# Patient Record
Sex: Male | Born: 1975 | Race: White | Hispanic: No | Marital: Married | Smoking: Never smoker
Health system: Southern US, Community
[De-identification: ages and names within clinical notes are randomized; demographics above are authoritative.]

## PROBLEM LIST (undated history)

## (undated) DIAGNOSIS — K4030 Unilateral inguinal hernia, with obstruction, without gangrene, not specified as recurrent: Secondary | ICD-10-CM

## (undated) HISTORY — DX: Unilateral inguinal hernia, with obstruction, without gangrene, not specified as recurrent: K40.30

---

## 2014-02-16 ENCOUNTER — Ambulatory Visit: Payer: Self-pay | Admitting: Family Medicine

## 2015-11-12 IMAGING — US US EXTREM NON VASC*L* COMPLETE
1 series · 14 of 25 positions shown · non-contrast
Comparison: None

CLINICAL DATA: Palpable left groin mass for 4 months.

EXAM:
ULTRASOUND LEFT LOWER EXTREMITY LIMITED
TECHNIQUE: Ultrasound examination of the lower extremity soft tissues was
performed in the area of clinical concern.

[Series 1: us extrem non vasc*left* complete · 0.06mm/px · 33 acquisitions, 14 frames shown]
[im 1/33]
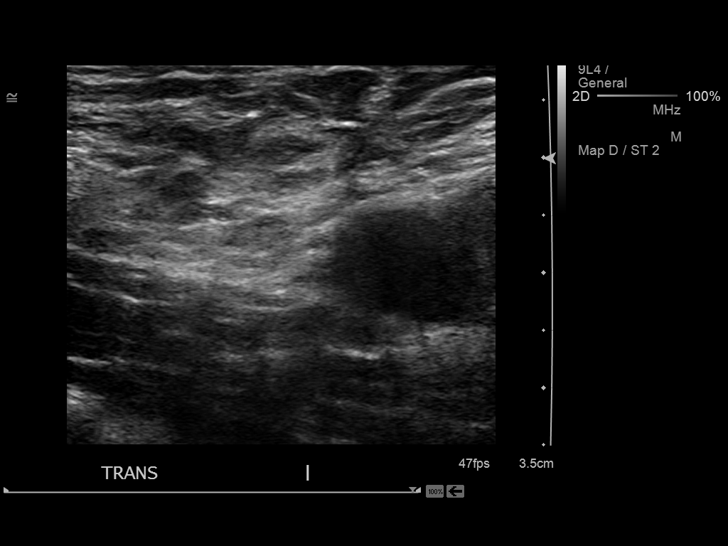
[im 3/33]
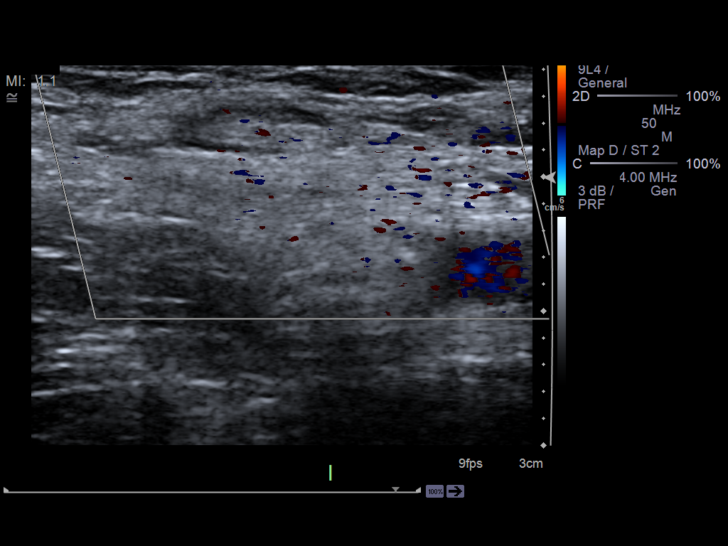
[im 6/33]
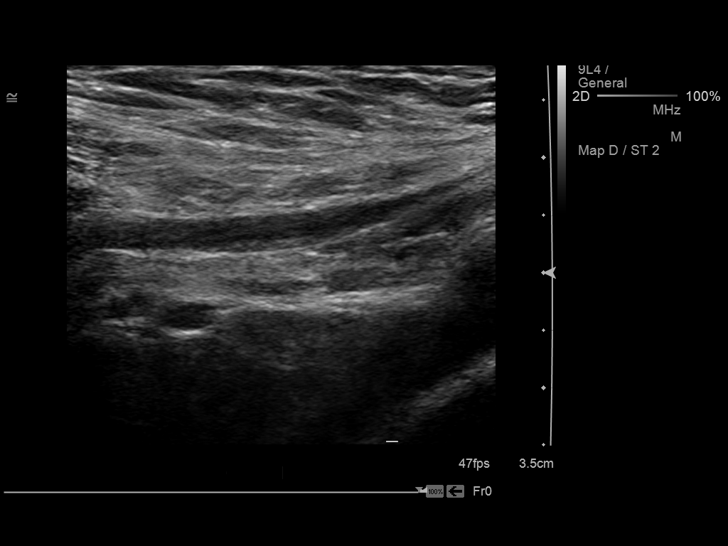
[im 9/33]
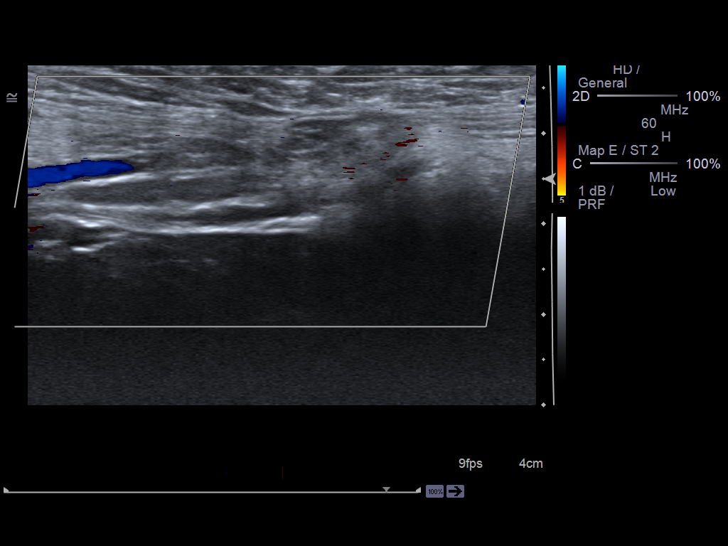
[im 11/33]
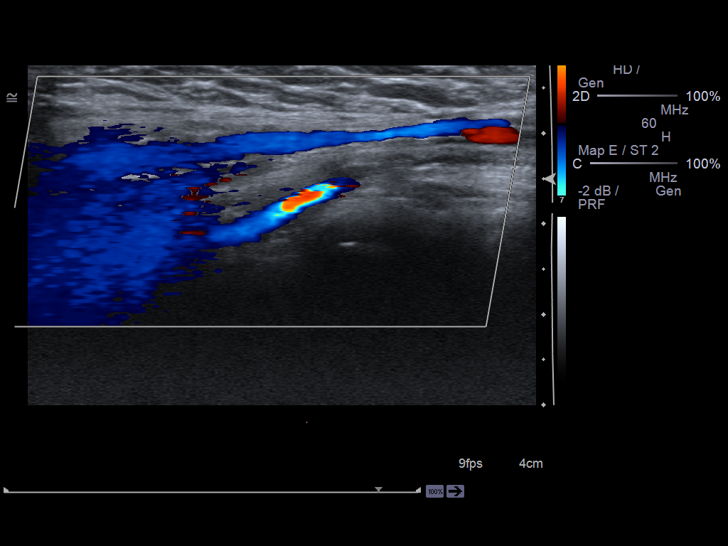
[im 13/33]
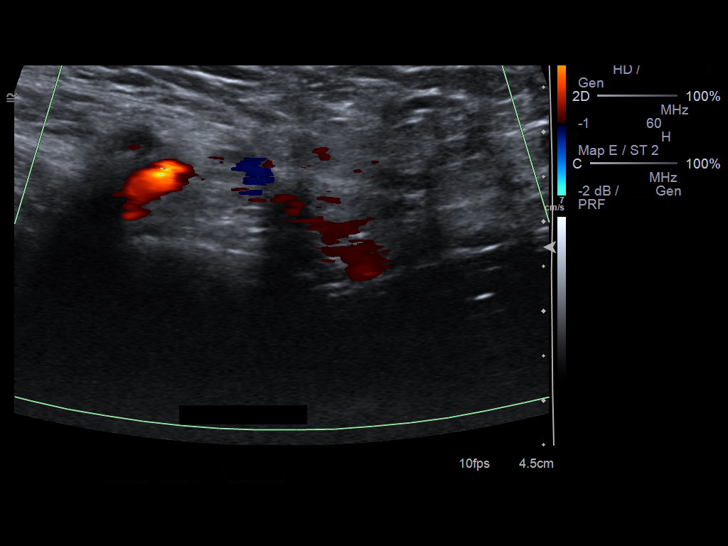
[im 15/33]
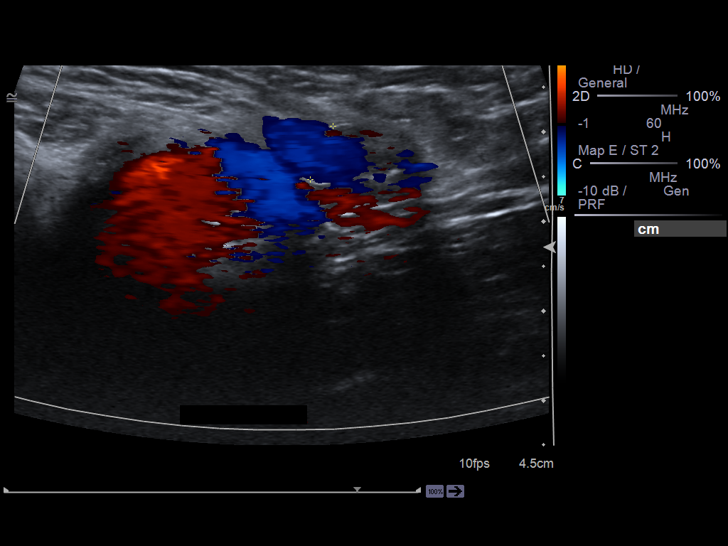
[im 18/33]
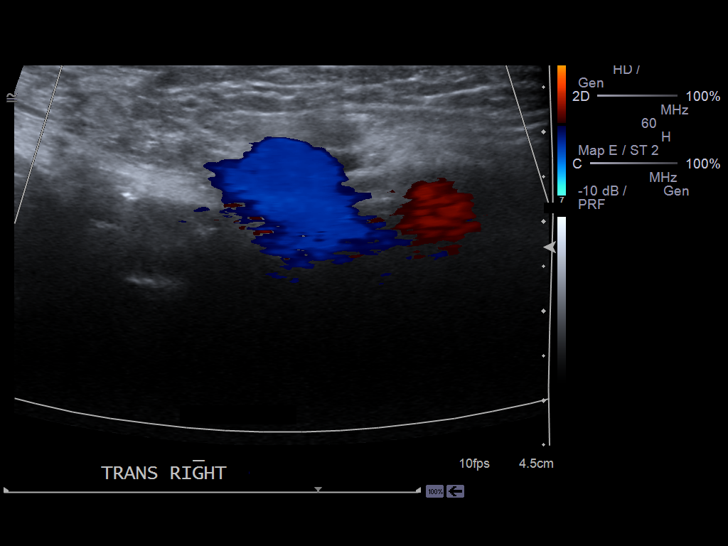
[im 21/33]
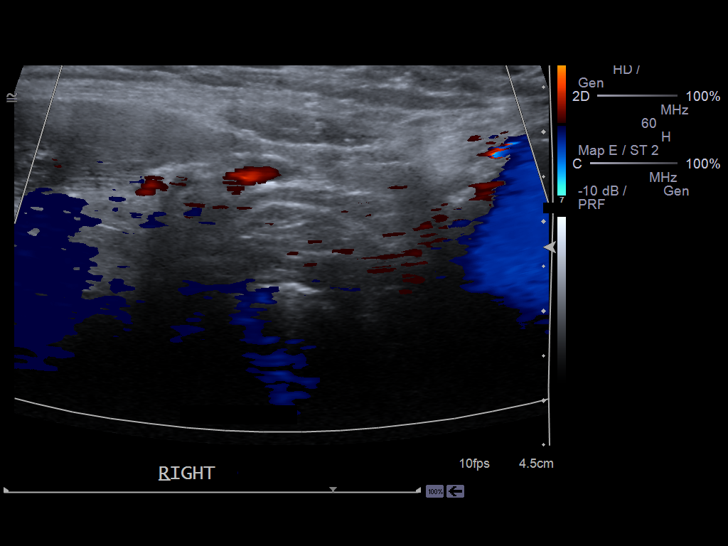
[im 22/33]
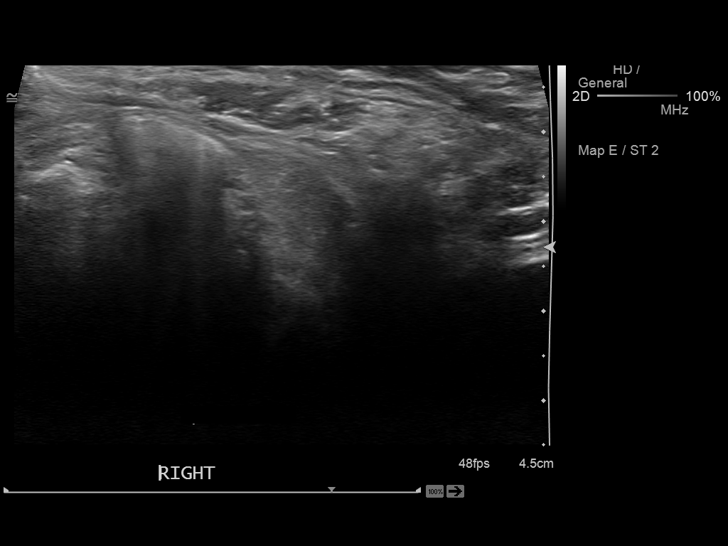
[im 25/33]
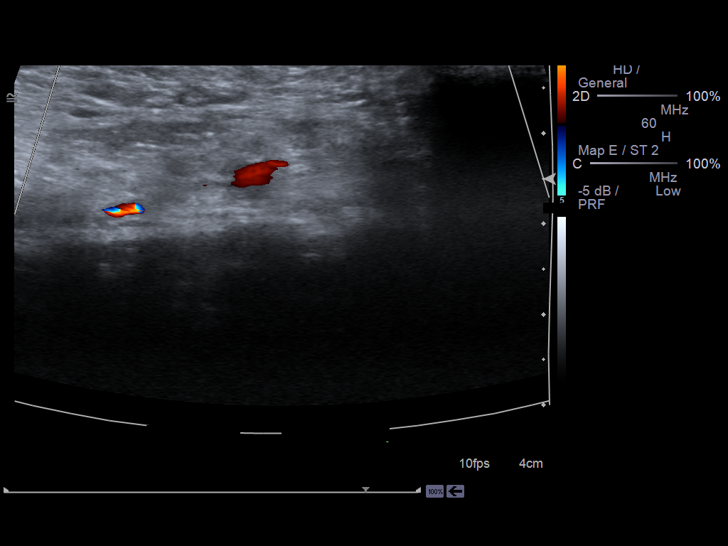
[im 27/33]
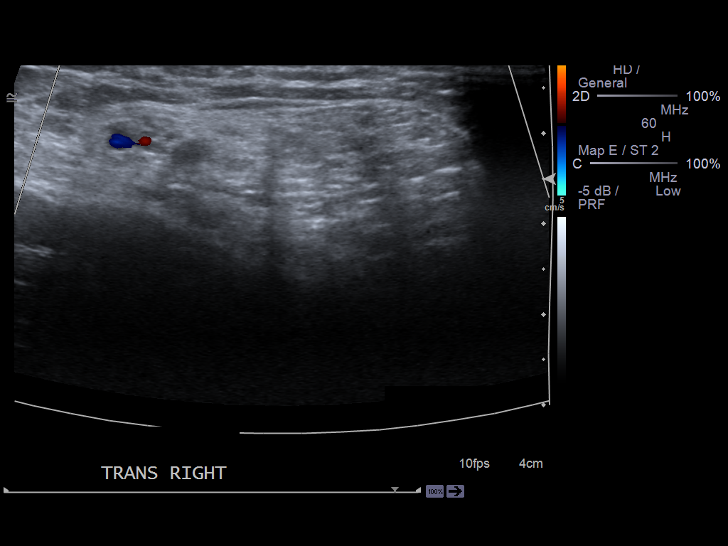
[im 30/33]
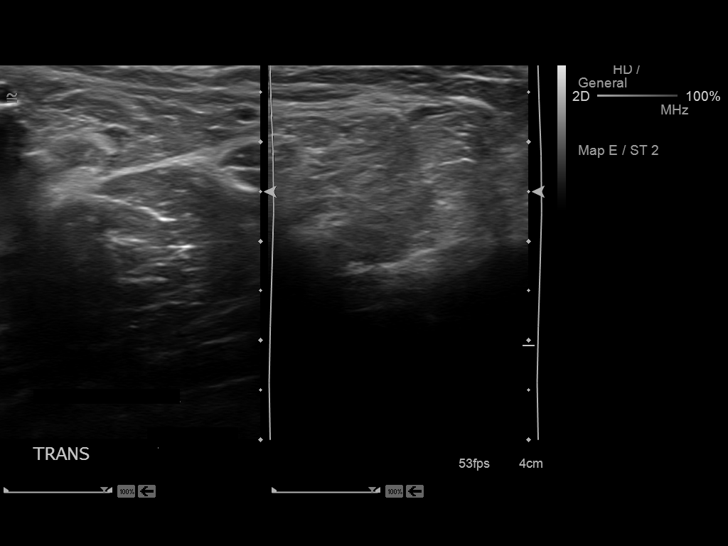
[im 33/33]
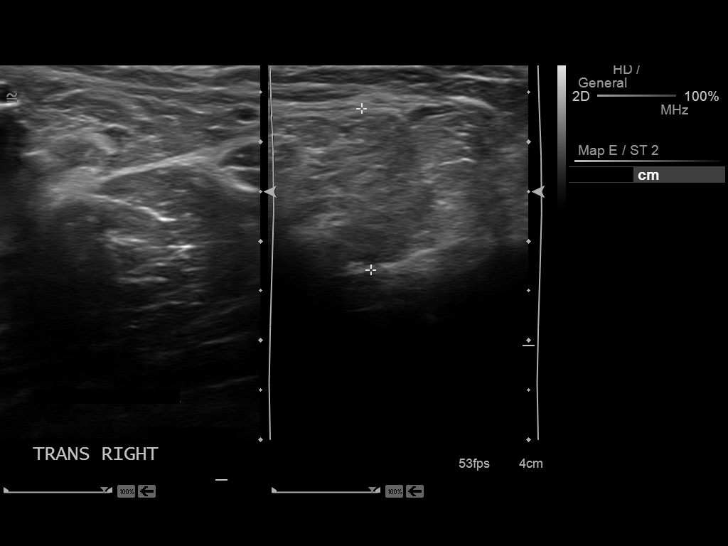

[14 of 25 positions shown; findings below may reference images not displayed]

FINDINGS: Targeted ultrasound was performed of the left groin in the area of
clinical concern. In the standing position, there is asymmetric soft
tissue which protrudes into the left inguinal region with
sonographic appearance suggestive of fat. No bowel is identified.

A mildly prominent left inguinal lymph node measures 1.6 x 0.5 x
cm with unremarkable morphologic appearance. Slightly prominent
veins were noted in the left groin with Valsalva.
IMPRESSION: Findings suggestive fat containing left inguinal hernia.

## 2016-06-18 ENCOUNTER — Emergency Department: Payer: 59

## 2016-06-18 ENCOUNTER — Observation Stay
Admission: EM | Admit: 2016-06-18 | Discharge: 2016-06-19 | DRG: 352 | Disposition: A | Discharge status: Home / Self Care | Payer: 59 | Attending: Surgery | Admitting: Surgery

## 2016-06-18 ENCOUNTER — Encounter: Payer: Self-pay | Admitting: *Deleted

## 2016-06-18 DIAGNOSIS — K4030 Unilateral inguinal hernia, with obstruction, without gangrene, not specified as recurrent: Principal | ICD-10-CM | POA: Diagnosis present

## 2016-06-18 DIAGNOSIS — K4090 Unilateral inguinal hernia, without obstruction or gangrene, not specified as recurrent: Secondary | ICD-10-CM | POA: Diagnosis not present

## 2016-06-18 DIAGNOSIS — R1032 Left lower quadrant pain: Secondary | ICD-10-CM | POA: Diagnosis present

## 2016-06-18 LAB — URINALYSIS, COMPLETE (UACMP) WITH MICROSCOPIC
Bacteria, UA: NONE SEEN
Bilirubin Urine: NEGATIVE
Glucose, UA: NEGATIVE mg/dL
Hgb urine dipstick: NEGATIVE
Ketones, ur: 5 mg/dL — AB
LEUKOCYTES UA: NEGATIVE
NITRITE: NEGATIVE
PH: 6 (ref 5.0–8.0)
Protein, ur: NEGATIVE mg/dL
SPECIFIC GRAVITY, URINE: 1.019 (ref 1.005–1.030)

## 2016-06-18 LAB — CBC
HEMATOCRIT: 47.2 % (ref 40.0–52.0)
HEMOGLOBIN: 16.2 g/dL (ref 13.0–18.0)
MCH: 28.8 pg (ref 26.0–34.0)
MCHC: 34.3 g/dL (ref 32.0–36.0)
MCV: 83.9 fL (ref 80.0–100.0)
Platelets: 295 10*3/uL (ref 150–440)
RBC: 5.63 MIL/uL (ref 4.40–5.90)
RDW: 13.1 % (ref 11.5–14.5)
WBC: 14.3 10*3/uL — ABNORMAL HIGH (ref 3.8–10.6)

## 2016-06-18 LAB — COMPREHENSIVE METABOLIC PANEL
ALK PHOS: 69 U/L (ref 38–126)
ALT: 19 U/L (ref 17–63)
AST: 18 U/L (ref 15–41)
Albumin: 4.6 g/dL (ref 3.5–5.0)
Anion gap: 7 (ref 5–15)
BILIRUBIN TOTAL: 0.9 mg/dL (ref 0.3–1.2)
BUN: 16 mg/dL (ref 6–20)
CALCIUM: 9.3 mg/dL (ref 8.9–10.3)
CO2: 29 mmol/L (ref 22–32)
CREATININE: 0.79 mg/dL (ref 0.61–1.24)
Chloride: 101 mmol/L (ref 101–111)
Glucose, Bld: 100 mg/dL — ABNORMAL HIGH (ref 65–99)
Potassium: 3.5 mmol/L (ref 3.5–5.1)
Sodium: 137 mmol/L (ref 135–145)
TOTAL PROTEIN: 7.9 g/dL (ref 6.5–8.1)

## 2016-06-18 MED ORDER — ACETAMINOPHEN 500 MG PO TABS
1000.0000 mg | ORAL_TABLET | Freq: Four times a day (QID) | ORAL | Status: DC
  Administered 2016-06-18 – 2016-06-19 (×2): 1000 mg via ORAL
  Filled 2016-06-18 (×2): qty 2

## 2016-06-18 MED ORDER — METHOCARBAMOL 500 MG PO TABS: 500.0000 mg | ORAL_TABLET | Freq: Four times a day (QID) | ORAL | Status: DC | PRN

## 2016-06-18 MED ORDER — ONDANSETRON HCL 4 MG/2ML IJ SOLN: 4.0000 mg | Freq: Four times a day (QID) | INTRAMUSCULAR | Status: DC | PRN

## 2016-06-18 MED ORDER — CEFAZOLIN SODIUM-DEXTROSE 2-3 GM-% IV SOLR
2.0000 g | INTRAVENOUS | Status: AC
  Administered 2016-06-19: 2 g via INTRAVENOUS
  Filled 2016-06-18 (×3): qty 50

## 2016-06-18 MED ORDER — OXYCODONE-ACETAMINOPHEN 5-325 MG PO TABS
1.0000 | ORAL_TABLET | ORAL | Status: DC | PRN
  Administered 2016-06-18: 1 via ORAL

## 2016-06-18 MED ORDER — IOPAMIDOL (ISOVUE-300) INJECTION 61%
30.0000 mL | Freq: Once | INTRAVENOUS | Status: AC | PRN
  Administered 2016-06-18: 30 mL via ORAL
  Filled 2016-06-18: qty 30

## 2016-06-18 MED ORDER — OXYCODONE HCL 5 MG PO TABS: 5.0000 mg | ORAL_TABLET | ORAL | Status: DC | PRN

## 2016-06-18 MED ORDER — HYDRALAZINE HCL 20 MG/ML IJ SOLN: 10.0000 mg | INTRAMUSCULAR | Status: DC | PRN

## 2016-06-18 MED ORDER — ONDANSETRON 4 MG PO TBDP: 4.0000 mg | ORAL_TABLET | Freq: Four times a day (QID) | ORAL | Status: DC | PRN

## 2016-06-18 MED ORDER — KETOROLAC TROMETHAMINE 30 MG/ML IJ SOLN
30.0000 mg | Freq: Four times a day (QID) | INTRAMUSCULAR | Status: DC
  Administered 2016-06-18 – 2016-06-19 (×2): 30 mg via INTRAVENOUS
  Filled 2016-06-18 (×2): qty 1

## 2016-06-18 MED ORDER — SODIUM CHLORIDE 0.9 % IV BOLUS (SEPSIS): 1000.0000 mL | Freq: Once | INTRAVENOUS | Status: DC

## 2016-06-18 MED ORDER — ALPRAZOLAM 0.5 MG PO TABS: 0.5000 mg | ORAL_TABLET | Freq: Three times a day (TID) | ORAL | Status: DC | PRN

## 2016-06-18 MED ORDER — CEFAZOLIN SODIUM-DEXTROSE 2-4 GM/100ML-% IV SOLN
2.0000 g | Freq: Once | INTRAVENOUS | Status: DC
  Filled 2016-06-18: qty 100

## 2016-06-18 MED ORDER — PANTOPRAZOLE SODIUM 40 MG IV SOLR
40.0000 mg | Freq: Every day | INTRAVENOUS | Status: DC
  Administered 2016-06-18: 40 mg via INTRAVENOUS
  Filled 2016-06-18: qty 40

## 2016-06-18 MED ORDER — IOPAMIDOL (ISOVUE-300) INJECTION 61%
100.0000 mL | Freq: Once | INTRAVENOUS | Status: AC | PRN
  Administered 2016-06-18: 100 mL via INTRAVENOUS
  Filled 2016-06-18: qty 100

## 2016-06-18 MED ORDER — MORPHINE SULFATE (PF) 4 MG/ML IV SOLN: 4.0000 mg | INTRAVENOUS | Status: DC | PRN

## 2016-06-18 MED ORDER — SODIUM CHLORIDE 0.9 % IV BOLUS (SEPSIS)
1000.0000 mL | Freq: Once | INTRAVENOUS | Status: AC
  Administered 2016-06-18: 1000 mL via INTRAVENOUS

## 2016-06-18 MED ORDER — OXYCODONE-ACETAMINOPHEN 5-325 MG PO TABS
ORAL_TABLET | ORAL | Status: AC
  Filled 2016-06-18: qty 1

## 2016-06-18 MED ORDER — DEXTROSE IN LACTATED RINGERS 5 % IV SOLN
INTRAVENOUS | Status: DC
  Administered 2016-06-18: via INTRAVENOUS

## 2016-06-18 MED ORDER — ENOXAPARIN SODIUM 40 MG/0.4ML ~~LOC~~ SOLN
40.0000 mg | Freq: Once | SUBCUTANEOUS | Status: DC
  Filled 2016-06-18: qty 0.4

## 2016-06-18 NOTE — ED Triage Notes (Addendum)
States left groin pain, states he noticed a hernia in the area about a year ago, states Friday night pain increased in left groin, states he feels as if the hernia is larger then it was, denies any discoloration,  States some scrotal swelling, denies any urinary symptoms

## 2016-06-18 NOTE — ED Provider Notes (Signed)
[facility] Emergency Department Provider Note  ____________________________________________  Time seen: Approximately 5:52 PM  I have reviewed the triage vital signs and the nursing notes.   HISTORY  Chief Complaint Groin Pain    HPI Jake Diaz is a 41 y.o. male who complains of left groin pain with a large area of swelling that developed over the last 3 days. He reports a history of a hernia in that area over the past year, which had not bothered him much, but over the last several days has become more persistent and severe. Still having bowel movements. No vomiting. Able to eat and drink. No fever or chills.     History reviewed. No pertinent past medical history.   There are no active problems to display for this patient.    History reviewed. No pertinent surgical history.   Prior to Admission medications   Medication Sig Start Date End Date Taking? Authorizing Provider  acetaminophen (TYLENOL) 325 MG tablet Take 650 mg by mouth every 6 (six) hours as needed.   Yes Historical Provider, MD     Allergies Patient has no known allergies.   No family history on file.  Social History Social History  Substance Use Topics  . Smoking status: Never Smoker  . Smokeless tobacco: Never Used  . Alcohol use No    Review of Systems  Constitutional:   No fever or chills.  ENT:   No sore throat. No rhinorrhea. Cardiovascular:   No chest pain. Respiratory:   No dyspnea or cough. Gastrointestinal:   Positive lower abdominal pain as above. No vomiting or diarrhea. No constipation.   Genitourinary:   Negative for dysuria or difficulty urinating. Musculoskeletal:   Negative for focal pain or swelling Neurological:   Negative for headaches 10-point ROS otherwise negative.  ____________________________________________   PHYSICAL EXAM:  VITAL SIGNS: ED Triage Vitals  Enc Vitals Group     BP 06/18/16 1105 (!) 141/88     Pulse Rate  06/18/16 1105 94     Resp 06/18/16 1105 18     Temp 06/18/16 1105 98.4 F (36.9 C)     Temp Source 06/18/16 1105 Oral     SpO2 06/18/16 1105 98 %     Weight 06/18/16 1106 150 lb (68 kg)     Height 06/18/16 1106 5\' 7"  (1.702 m)     Head Circumference --      Peak Flow --      Pain Score 06/18/16 1113 9     Pain Loc --      Pain Edu? --      Excl. in GC? --     Vital signs reviewed, nursing assessments reviewed.   Constitutional:   Alert and oriented. Well appearing and in no distress. Eyes:   No scleral icterus. No conjunctival pallor. PERRL. EOMI.  No nystagmus. ENT   Head:   Normocephalic and atraumatic.   Nose:   No congestion/rhinnorhea. No septal hematoma   Mouth/Throat:   MMM, no pharyngeal erythema. No peritonsillar mass.    Neck:   No stridor. No SubQ emphysema. No meningismus. Hematological/Lymphatic/Immunilogical:   No cervical lymphadenopathy. Cardiovascular:   RRR. Symmetric bilateral radial and DP pulses.  No murmurs.  Respiratory:   Normal respiratory effort without tachypnea nor retractions. Breath sounds are clear and equal bilaterally. No wheezes/rales/rhonchi. Gastrointestinal:   Soft with a large soft tissue mass palpable in the left scrotum extending up to the inguinal canal. Tender to the touch. Not  reducible on exam. Abdomen is non- distended. There is no CVA tenderness.  No rebound, rigidity, or guarding. Genitourinary:   Left scrotal mass as above. No perineal inflammation. Unremarkable penis Musculoskeletal:   Normal range of motion in all extremities. No joint effusions.  No lower extremity tenderness.  No edema. Neurologic:   Normal speech and language.  CN 2-10 normal. Motor grossly intact. No gross focal neurologic deficits are appreciated.  Skin:    Skin is warm, dry and intact. No rash noted.  No petechiae, purpura, or bullae.  ____________________________________________    LABS (pertinent positives/negatives) (all labs ordered are  listed, but only abnormal results are displayed) Labs Reviewed  URINALYSIS, COMPLETE (UACMP) WITH MICROSCOPIC - Abnormal; Notable for the following:       Result Value   Color, Urine YELLOW (*)    APPearance CLEAR (*)    Ketones, ur 5 (*)    Squamous Epithelial / LPF 0-5 (*)    All other components within normal limits  CBC - Abnormal; Notable for the following:    WBC 14.3 (*)    All other components within normal limits  COMPREHENSIVE METABOLIC PANEL - Abnormal; Notable for the following:    Glucose, Bld 100 (*)    All other components within normal limits   ____________________________________________   EKG    ____________________________________________    RADIOLOGY  Ct Abdomen Pelvis W Contrast  Result Date: 06/18/2016 CLINICAL DATA:  Left groin pain, hernia, scrotal swelling EXAM: CT ABDOMEN AND PELVIS WITH CONTRAST TECHNIQUE: Multidetector CT imaging of the abdomen and pelvis was performed using the standard protocol following bolus administration of intravenous contrast. CONTRAST:  ISOVUE-300 IOPAMIDOL (ISOVUE-300) INJECTION 61% COMPARISON:  None. FINDINGS: Lower chest: Lung bases are clear. Hepatobiliary: Liver is within normal limits, noting a 4 mm probable cyst in segment 8 (series 2/image 19). Gallbladder is unremarkable. No intrahepatic or extrahepatic ductal dilatation. Pancreas: Within normal limits. Spleen: Within normal limits. Adrenals/Urinary Tract: Adrenal glands are within normal limits. Kidneys are within normal limits.  No hydronephrosis. Bladder is within normal limits. Stomach/Bowel: Stomach is within normal limits. No evidence of bowel obstruction. Normal appendix (series 2/ image 53). Vascular/Lymphatic: No evidence of abdominal aortic aneurysm. Circumaortic left renal vein. No suspicious abdominopelvic lymphadenopathy. Reproductive: Prostate is unremarkable. Other: No abdominopelvic ascites. Moderate to large fat containing left inguinal/ scrotal  hernia. Trace fluid within the hernia as it enters the left scrotum (series 2/image 90), incompletely visualized. Musculoskeletal: Visualized osseous structures are within normal limits. IMPRESSION: Moderate to large fat containing left inguinal/ scrotal hernia, incompletely visualized. Electronically Signed   By: Charline Bills M.D.   On: 06/18/2016 16:25    ____________________________________________   PROCEDURES Procedures  ____________________________________________   INITIAL IMPRESSION / ASSESSMENT AND PLAN / ED COURSE  Pertinent labs & imaging results that were available during my care of the patient were reviewed by me and considered in my medical decision making (see chart for details).  Patient presents with abdominal pain, on exam found to have large hernia, likely indirect inguinal hernia. Concern for incarcerated bowel.     Clinical Course as of Jun 19 1750  Mon Jun 18, 2016  1432 D/w Pabon. Rec. CT for further delineation of tissue/extent and then will consult.   [PS]    Clinical Course User Index [PS] Sharman Cheek, MD    ----------------------------------------- 5:57 PM on 06/18/2016 -----------------------------------------  Discussed with Dr. Everlene Farrier after his evaluation of the patient. Will admit for surgical management. No evidence  of  bowel involvement.   ____________________________________________   FINAL CLINICAL IMPRESSION(S) / ED DIAGNOSES  Final diagnoses:  Inguinal hernia of left side without obstruction or gangrene      New Prescriptions   No medications on file     Portions of this note were generated with dragon dictation software. Dictation errors may occur despite best attempts at proofreading.    Sharman CheekPhillip Jonnell Hentges, MD 06/18/16 [id number]

## 2016-06-18 NOTE — H&P (Signed)
Patient ID: Jake Diaz, male   DOB: 1976/01/10, 41 y.o.   MRN: [id number]  HPI Jake Diaz is a 41 y.o. male with a 3 day hx of left inguinal pain. He reports the pain is moderate to severe in intensity, sharp and intermittent and  located left inguinal area and worsens with movement. HE has had a hx of inguinal hernia for about 1 1/2 years but it was asymptomatic until last week. HE does have good cv performance and is able to do more than 4 METS w/o SOb or C/P. HE is otherwise healthy and no previous abd operations Ct scan reviewed and there is a large LIH w a piece of incarcerated fat. No bowel involvement.  HPI  History reviewed. No pertinent past medical history.  History reviewed. No pertinent surgical history.  No family history on file.  Social History Social History  Substance Use Topics  . Smoking status: Never Smoker  . Smokeless tobacco: Never Used  . Alcohol use No    No Known Allergies  Current Facility-Administered Medications  Medication Dose Route Frequency Provider Last Rate Last Dose  . oxyCODONE-acetaminophen (PERCOCET/ROXICET) 5-325 MG per tablet 1 tablet  1 tablet Oral Q30 min PRN Sharman CheekPhillip Stafford, MD   1 tablet at 06/18/16 1130  . oxyCODONE-acetaminophen (PERCOCET/ROXICET) 5-325 MG per tablet            No current outpatient prescriptions on file.     Review of Systems A 10 point review of systems was asked and was negative except for the information on the HPI  Physical Exam Blood pressure 133/85, pulse 76, temperature 98.4 F (36.9 C), temperature source Oral, resp. rate 18, height 5\' 7"  (1.702 m), weight 68 kg (150 lb), SpO2 96 %. CONSTITUTIONAL: NAD EYES: Pupils are equal, round, and reactive to light, Sclera are non-icteric. EARS, NOSE, MOUTH AND THROAT: The oropharynx is clear. The oral mucosa is pink and moist. Hearing is intact to voice. LYMPH NODES:  Lymph nodes in the neck are normal. RESPIRATORY:  Lungs are clear. There is  normal respiratory effort, with equal breath sounds bilaterally, and without pathologic use of accessory muscles. CARDIOVASCULAR: Heart is regular without murmurs, gallops, or rubs. GI: The abdomen is  soft, No peritonitis. There is an incarcerated left inguinal hernia. With significant tenderness to palpation GU:  MUSCULOSKELETAL: Normal muscle strength and tone. No cyanosis or edema.   SKIN: Turgor is good and there are no pathologic skin lesions or ulcers. NEUROLOGIC: Motor and sensation is grossly normal. Cranial nerves are grossly intact. PSYCH:  Oriented to person, place and time. Affect is normal.  Data Reviewed  I have personally reviewed the patient's imaging, laboratory findings and medical records.    Assessment/Plan Incarcerated left inguinal hernia containing fat. No evidence of obstruction or strangulation. DW the pt in detail and I do recommend admission to the hospital and repair in am. I have explained the procedure, risks, and aftercare of inguinal hernia repair to Jake Burnavid Isaac LantnerRisks include but are not limited to bleeding, infection, wound problems, anesthesia, recurrence, bladder or intestine injury, urinary retention, testicular dysfunction, chronic pain, mesh problems.  He  seems to understand and agrees to proceed.  Questions were answered to his stated satisfaction. We will attempt a laparoscopic approach if feasible but he does understand that there is a higher chance of conversion to open    Sterling Bigiego Aymara Sassi, MD FACS General Surgeon 06/18/2016, 5:36 PM

## 2016-06-18 NOTE — ED Triage Notes (Signed)
[facility] possible [facility]ulated her[facility].

## 2016-06-19 ENCOUNTER — Inpatient Hospital Stay: Payer: 59 | Admitting: Anesthesiology

## 2016-06-19 ENCOUNTER — Encounter
Admission: EM | Disposition: A | Discharge status: Home / Self Care | Payer: Self-pay | Source: Home / Self Care | Attending: Emergency Medicine

## 2016-06-19 DIAGNOSIS — K4090 Unilateral inguinal hernia, without obstruction or gangrene, not specified as recurrent: Secondary | ICD-10-CM | POA: Diagnosis not present

## 2016-06-19 HISTORY — PX: LAPAROSCOPIC INGUINAL HERNIA REPAIR: SUR788

## 2016-06-19 HISTORY — PX: INGUINAL HERNIA REPAIR: SHX194

## 2016-06-19 LAB — BASIC METABOLIC PANEL
Anion gap: 4 — ABNORMAL LOW (ref 5–15)
BUN: 12 mg/dL (ref 6–20)
CHLORIDE: 103 mmol/L (ref 101–111)
CO2: 31 mmol/L (ref 22–32)
CREATININE: 0.77 mg/dL (ref 0.61–1.24)
Calcium: 8.8 mg/dL — ABNORMAL LOW (ref 8.9–10.3)
GFR calc non Af Amer: >60 mL/min (ref >60)
Glucose, Bld: 103 mg/dL — ABNORMAL HIGH (ref 65–99)
POTASSIUM: 3.2 mmol/L — AB (ref 3.5–5.1)
Sodium: 138 mmol/L (ref 135–145)

## 2016-06-19 LAB — CBC
HEMATOCRIT: 41.5 % (ref 40.0–52.0)
HEMOGLOBIN: 14.6 g/dL (ref 13.0–18.0)
MCH: 29.6 pg (ref 26.0–34.0)
MCHC: 35.2 g/dL (ref 32.0–36.0)
MCV: 84.1 fL (ref 80.0–100.0)
Platelets: 244 10*3/uL (ref 150–440)
RBC: 4.93 MIL/uL (ref 4.40–5.90)
RDW: 13.2 % (ref 11.5–14.5)
WBC: 6.5 10*3/uL (ref 3.8–10.6)

## 2016-06-19 LAB — SURGICAL PCR SCREEN
MRSA, PCR: NEGATIVE
Staphylococcus aureus: POSITIVE — AB

## 2016-06-19 SURGERY — REPAIR, HERNIA, INGUINAL, LAPAROSCOPIC
Anesthesia: General | Laterality: Left | Wound class: Clean

## 2016-06-19 MED ORDER — BUPIVACAINE-EPINEPHRINE (PF) 0.25% -1:200000 IJ SOLN
INTRAMUSCULAR | Status: DC | PRN
  Administered 2016-06-19: 30 mL

## 2016-06-19 MED ORDER — OXYCODONE-ACETAMINOPHEN 7.5-325 MG PO TABS
2.0000 | ORAL_TABLET | ORAL | Status: DC | PRN
  Filled 2016-06-19: qty 2

## 2016-06-19 MED ORDER — FENTANYL CITRATE (PF) 100 MCG/2ML IJ SOLN
INTRAMUSCULAR | Status: DC | PRN
  Administered 2016-06-19: 100 ug via INTRAVENOUS
  Administered 2016-06-19 (×2): 50 ug via INTRAVENOUS

## 2016-06-19 MED ORDER — MUPIROCIN 2 % EX OINT
1.0000 "application " | TOPICAL_OINTMENT | Freq: Two times a day (BID) | CUTANEOUS | Status: DC
  Filled 2016-06-19: qty 22

## 2016-06-19 MED ORDER — SUGAMMADEX SODIUM 200 MG/2ML IV SOLN
INTRAVENOUS | Status: DC | PRN
  Administered 2016-06-19: 145 mg via INTRAVENOUS

## 2016-06-19 MED ORDER — FENTANYL CITRATE (PF) 100 MCG/2ML IJ SOLN: 25.0000 ug | INTRAMUSCULAR | Status: DC | PRN

## 2016-06-19 MED ORDER — ROCURONIUM BROMIDE 100 MG/10ML IV SOLN
INTRAVENOUS | Status: DC | PRN
  Administered 2016-06-19: 10 mg via INTRAVENOUS
  Administered 2016-06-19: 40 mg via INTRAVENOUS

## 2016-06-19 MED ORDER — ROCURONIUM BROMIDE 50 MG/5ML IV SOLN
INTRAVENOUS | Status: AC
  Filled 2016-06-19: qty 1

## 2016-06-19 MED ORDER — PROPOFOL 10 MG/ML IV BOLUS
INTRAVENOUS | Status: AC
  Filled 2016-06-19: qty 20

## 2016-06-19 MED ORDER — CHLORHEXIDINE GLUCONATE CLOTH 2 % EX PADS
6.0000 | MEDICATED_PAD | Freq: Every day | CUTANEOUS | Status: DC
  Administered 2016-06-19: 6 via TOPICAL

## 2016-06-19 MED ORDER — MIDAZOLAM HCL 2 MG/2ML IJ SOLN
INTRAMUSCULAR | Status: DC | PRN
Start: 2016-06-19 — End: 2016-06-19
  Administered 2016-06-19: 2 mg via INTRAVENOUS

## 2016-06-19 MED ORDER — LACTATED RINGERS IV SOLN
INTRAVENOUS | Status: DC | PRN
  Administered 2016-06-19: 08:00:00 via INTRAVENOUS

## 2016-06-19 MED ORDER — FENTANYL CITRATE (PF) 100 MCG/2ML IJ SOLN
INTRAMUSCULAR | Status: AC
  Filled 2016-06-19: qty 2

## 2016-06-19 MED ORDER — SUGAMMADEX SODIUM 200 MG/2ML IV SOLN
INTRAVENOUS | Status: AC
  Filled 2016-06-19: qty 2

## 2016-06-19 MED ORDER — MIDAZOLAM HCL 2 MG/2ML IJ SOLN
INTRAMUSCULAR | Status: AC
  Filled 2016-06-19: qty 2

## 2016-06-19 MED ORDER — SUCCINYLCHOLINE CHLORIDE 20 MG/ML IJ SOLN
INTRAMUSCULAR | Status: AC
  Filled 2016-06-19: qty 1

## 2016-06-19 MED ORDER — PROMETHAZINE HCL 25 MG/ML IJ SOLN
6.2500 mg | INTRAMUSCULAR | Status: DC | PRN
Start: 2016-06-19 — End: 2016-06-19

## 2016-06-19 MED ORDER — DEXAMETHASONE SODIUM PHOSPHATE 10 MG/ML IJ SOLN
INTRAMUSCULAR | Status: DC | PRN
  Administered 2016-06-19: 10 mg via INTRAVENOUS

## 2016-06-19 MED ORDER — MEPERIDINE HCL 50 MG/ML IJ SOLN: 6.2500 mg | INTRAMUSCULAR | Status: DC | PRN

## 2016-06-19 MED ORDER — OXYCODONE HCL 5 MG PO TABS: 5.0000 mg | ORAL_TABLET | Freq: Once | ORAL | Status: DC | PRN

## 2016-06-19 MED ORDER — DEXAMETHASONE SODIUM PHOSPHATE 10 MG/ML IJ SOLN
INTRAMUSCULAR | Status: AC
  Filled 2016-06-19: qty 1

## 2016-06-19 MED ORDER — OXYCODONE-ACETAMINOPHEN 7.5-325 MG PO TABS: 2.0000 | ORAL_TABLET | ORAL | 0 refills | Status: DC | PRN

## 2016-06-19 MED ORDER — OXYCODONE HCL 5 MG/5ML PO SOLN: 5.0000 mg | Freq: Once | ORAL | Status: DC | PRN

## 2016-06-19 MED ORDER — ONDANSETRON HCL 4 MG/2ML IJ SOLN
INTRAMUSCULAR | Status: AC
  Filled 2016-06-19: qty 2

## 2016-06-19 MED ORDER — ONDANSETRON HCL 4 MG/2ML IJ SOLN
INTRAMUSCULAR | Status: DC | PRN
  Administered 2016-06-19: 4 mg via INTRAVENOUS

## 2016-06-19 MED ORDER — PROPOFOL 10 MG/ML IV BOLUS
INTRAVENOUS | Status: DC | PRN
  Administered 2016-06-19: 150 mg via INTRAVENOUS

## 2016-06-19 SURGICAL SUPPLY — 38 items
APPLICATOR COTTON TIP 6IN STRL (MISCELLANEOUS) ×3 IMPLANT
CANISTER SUCT 1200ML W/VALVE (MISCELLANEOUS) ×3 IMPLANT
CHLORAPREP W/TINT 26ML (MISCELLANEOUS) ×3 IMPLANT
DEFOGGER SCOPE WARMER CLEARIFY (MISCELLANEOUS) ×3 IMPLANT
DEVICE SECURE STRAP 25 ABSORB (INSTRUMENTS) ×3 IMPLANT
DISSECT BALLN SPACEMKR OVL PDB (BALLOONS) ×3
DISSECTOR BALLN SPCMKR OVL PDB (BALLOONS) ×1 IMPLANT
DISSECTOR KITTNER STICK (MISCELLANEOUS) ×2 IMPLANT
DISSECTORS/KITTNER STICK (MISCELLANEOUS) ×6
DRAPE INCISE IOBAN 66X45 STRL (DRAPES) ×3 IMPLANT
ELECT REM PT RETURN 9FT ADLT (ELECTROSURGICAL) ×3
ELECTRODE REM PT RTRN 9FT ADLT (ELECTROSURGICAL) ×1 IMPLANT
ENDOLOOP SUT PDS II  0 18 (SUTURE) ×2
ENDOLOOP SUT PDS II 0 18 (SUTURE) ×1 IMPLANT
GLOVE BIO SURGEON STRL SZ7 (GLOVE) ×18 IMPLANT
GOWN STRL REUS W/ TWL LRG LVL3 (GOWN DISPOSABLE) ×4 IMPLANT
GOWN STRL REUS W/TWL LRG LVL3 (GOWN DISPOSABLE) ×8
IRRIGATION STRYKERFLOW (MISCELLANEOUS) IMPLANT
IRRIGATOR STRYKERFLOW (MISCELLANEOUS)
IV NS 1000ML (IV SOLUTION) ×2
IV NS 1000ML BAXH (IV SOLUTION) ×1 IMPLANT
L-HOOK LAP DISP 36CM (ELECTROSURGICAL)
LHOOK LAP DISP 36CM (ELECTROSURGICAL) IMPLANT
LIQUID BAND (GAUZE/BANDAGES/DRESSINGS) ×3 IMPLANT
MESH 3DMAX 3X5 LT MED (Mesh General) ×3 IMPLANT
NEEDLE HYPO 22GX1.5 SAFETY (NEEDLE) ×3 IMPLANT
NS IRRIG 500ML POUR BTL (IV SOLUTION) ×3 IMPLANT
PACK LAP CHOLECYSTECTOMY (MISCELLANEOUS) ×3 IMPLANT
PENCIL ELECTRO HAND CTR (MISCELLANEOUS) ×3 IMPLANT
SCISSORS METZENBAUM CVD 33 (INSTRUMENTS) ×3 IMPLANT
SPONGE LAP 18X18 5 PK (GAUZE/BANDAGES/DRESSINGS) ×3 IMPLANT
SURGILUBE 2OZ TUBE FLIPTOP (MISCELLANEOUS) ×3 IMPLANT
SUT MNCRL AB 4-0 PS2 18 (SUTURE) ×3 IMPLANT
SUT VICRYL 0 AB UR-6 (SUTURE) ×3 IMPLANT
TRAY FOLEY CATH SILVER 16FR LF (SET/KITS/TRAYS/PACK) IMPLANT
TROCAR 5MM SINGLE VERSAONE (TROCAR) ×6 IMPLANT
TROCAR BALLN 10M OMST10SB SPAC (TROCAR) ×3 IMPLANT
TUBING INSUFFLATOR HI FLOW (MISCELLANEOUS) ×3 IMPLANT

## 2016-06-19 NOTE — Addendum Note (Signed)
Addendum  created 06/19/16 1028 by Omer JackJanice Cuma Polyakov, CRNA   Anesthesia Intra Meds edited

## 2016-06-19 NOTE — Discharge Summary (Signed)
  Patient ID: Jake Diaz MRN: [id number] DOB/AGE: Aug 08, 1975 41 y.o.  Admit date: 06/18/2016 Discharge date: 06/19/2016   Discharge Diagnoses:  Active Problems:   Incarcerated inguinal hernia   Inguinal hernia of left side without obstruction or gangrene   Procedures: lap Left IH repair w mesh  Hospital Course: Admitted with incarcerated LIH containing fat. Underwent a prompt lap repair of inguinal hernia with mesh and did very well. At the time of DC he was ambulating, tolerating diet and his VSS. His PE  showed a male in nad, awake. Abd: soft, incision c/d/I, no infection or recurrence. Ext: no edema. Condition at DC is stable.   Disposition: Final discharge disposition not confirmed  Discharge Instructions    Call MD for:  difficulty breathing, headache or visual disturbances    Complete by:  As directed    Call MD for:  extreme fatigue    Complete by:  As directed    Call MD for:  hives    Complete by:  As directed    Call MD for:  persistant dizziness or light-headedness    Complete by:  As directed    Call MD for:  persistant nausea and vomiting    Complete by:  As directed    Call MD for:  redness, tenderness, or signs of infection (pain, swelling, redness, odor or green/yellow discharge around incision site)    Complete by:  As directed    Call MD for:  severe uncontrolled pain    Complete by:  As directed    Call MD for:  temperature >100.4    Complete by:  As directed    Diet - low sodium heart healthy    Complete by:  As directed    Discharge instructions    Complete by:  As directed    Shower Thursday am   Increase activity slowly    Complete by:  As directed    Lifting restrictions    Complete by:  As directed    20 lbs x 6 wks     Allergies as of 06/19/2016   No Known Allergies     Medication List    STOP taking these medications   acetaminophen 325 MG tablet Commonly known as:  TYLENOL     TAKE these medications    oxyCODONE-acetaminophen 7.5-325 MG tablet Commonly known as:  PERCOCET Take 2 tablets by mouth every 4 (four) hours as needed for moderate pain.      Follow-up Information    Leafy Roiego F Caress Reffitt, MD. Call in 1 week.   Specialty:  General Surgery Why:  Dr. Everlene FarrierPabon office will notify you with your hospital follow-up Contact information: [address] [place] [address] [phone]            Sterling Bigiego Terril Amaro, MD FACS

## 2016-06-19 NOTE — Progress Notes (Signed)
Preoperative Review   Patient is met in the preoperative holding area. The history is reviewed in the chart and with the patient. I personally reviewed the options and rationale as well as the risks of this procedure that have been previously discussed with the patient. All questions asked by the patient and/or family were answered to their satisfaction.  Patient agrees to proceed with this procedure at this time.  Aalaysia Liggins M.D. FACS   

## 2016-06-19 NOTE — Anesthesia Procedure Notes (Signed)
Procedure Name: Intubation Date/Time: 06/19/2016 7:45 AM Performed by: Jonna Clark Pre-anesthesia Checklist: Patient identified, Patient being monitored, Timeout performed, Emergency Drugs available and Suction available Patient Re-evaluated:Patient Re-evaluated prior to inductionOxygen Delivery Method: Circle system utilized Preoxygenation: Pre-oxygenation with 100% oxygen Intubation Type: IV induction Ventilation: Mask ventilation without difficulty Laryngoscope Size: Mac and 3 Grade View: Grade II Tube type: Oral Tube size: 7.5 mm Number of attempts: 2 Airway Equipment and Method: Stylet Placement Confirmation: ETT inserted through vocal cords under direct vision,  positive ETCO2 and breath sounds checked- equal and bilateral Secured at: 21 cm Tube secured with: Tape Dental Injury: Teeth and Oropharynx as per pre-operative assessment

## 2016-06-19 NOTE — Anesthesia Postprocedure Evaluation (Signed)
Anesthesia Post Note  Patient: Jake Diaz  Procedure(s) Performed: Procedure(s) (LRB): LAPAROSCOPIC INGUINAL HERNIA (Left)  Patient location during evaluation: PACU Anesthesia Type: General Level of consciousness: awake and alert and oriented Pain management: pain level controlled Vital Signs Assessment: post-procedure vital signs reviewed and stable Respiratory status: spontaneous breathing, nonlabored ventilation and respiratory function stable Cardiovascular status: blood pressure returned to baseline and stable Postop Assessment: no signs of nausea or vomiting Anesthetic complications: no     Last Vitals:  Vitals:   06/19/16 0936 06/19/16 0951  BP: 111/71 111/70  Pulse: 72 84  Resp: 13 12  Temp:  36.4 C    Last Pain:  Vitals:   06/19/16 0951  TempSrc:   PainSc: 0-No pain                 Paitlyn Mcclatchey

## 2016-06-19 NOTE — Anesthesia Preprocedure Evaluation (Signed)
Anesthesia Evaluation  Patient identified by MRN, date of birth, ID band Patient awake    Reviewed: Allergy & Precautions, NPO status , Patient's Chart, lab work & pertinent test results  History of Anesthesia Complications Negative for: history of anesthetic complications  Airway Mallampati: II  TM Distance: >3 FB Neck ROM: Full    Dental no notable dental hx.    Pulmonary neg pulmonary ROS, neg sleep apnea, neg COPD,    breath sounds clear to auscultation- rhonchi (-) wheezing      Cardiovascular Exercise Tolerance: Good (-) hypertension(-) CAD and (-) Past MI  Rhythm:Regular Rate:Normal - Systolic murmurs and - Diastolic murmurs    Neuro/Psych negative neurological ROS  negative psych ROS   GI/Hepatic negative GI ROS, Neg liver ROS,   Endo/Other  negative endocrine ROSneg diabetes  Renal/GU negative Renal ROS     Musculoskeletal negative musculoskeletal ROS (+)   Abdominal (+) - obese,   Peds  Hematology negative hematology ROS (+)   Anesthesia Other Findings   Reproductive/Obstetrics                             Anesthesia Physical Anesthesia Plan  ASA: I  Anesthesia Plan: General   Post-op Pain Management:    Induction: Intravenous  Airway Management Planned: Oral ETT  Additional Equipment:   Intra-op Plan:   Post-operative Plan: Extubation in OR  Informed Consent: I have reviewed the patients History and Physical, chart, labs and discussed the procedure including the risks, benefits and alternatives for the proposed anesthesia with the patient or authorized representative who has indicated his/her understanding and acceptance.   Dental advisory given  Plan Discussed with: CRNA and Anesthesiologist  Anesthesia Plan Comments:         Anesthesia Quick Evaluation  

## 2016-06-19 NOTE — Transfer of Care (Signed)
Immediate Anesthesia Transfer of Care Note  Patient: Jake Diaz  Procedure(s) Performed: Procedure(s): LAPAROSCOPIC INGUINAL HERNIA (Left)  Patient Location: PACU  Anesthesia Type:General  Level of Consciousness: patient cooperative and lethargic  Airway & Oxygen Therapy: Patient Spontanous Breathing and Patient connected to face mask oxygen  Post-op Assessment: Report given to RN and Post -op Vital signs reviewed and stable  Post vital signs: Reviewed and stable  Last Vitals:  Vitals:   06/19/16 0606 06/19/16 0906  BP: 131/78 139/82  Pulse: 65 86  Resp: 20 (!) 26  Temp: 36.6 C 36.4 C    Last Pain:  Vitals:   06/19/16 0906  TempSrc:   PainSc: Asleep         Complications: No apparent anesthesia complications

## 2016-06-19 NOTE — Anesthesia Post-op Follow-up Note (Cosign Needed)
Anesthesia QCDR form completed.        

## 2016-06-19 NOTE — Op Note (Signed)
Laparoscopic Left Inguinal Hernia Repair  Jake Diaz  06/19/2016  Pre-operative Diagnosis: Incarcerated Left Inguinal Hernia  Post-operative Diagnosis: Same  Procedure: Laparoscopic preperitoneal repair of Left inguinal hernia  Surgeon: Sterling Bigiego Pabon, MD FACS  Anesthesia: Gen. with endotracheal tube  Findings: After Induction of GETA and pushing down on his left inguinal canal hernia was reduced. Left indirect inguinal hernia  Procedure Details  The patient was seen again in the Holding Room. The benefits, complications, treatment options, and expected outcomes were discussed with the patient. The risks of bleeding, infection, recurrence of symptoms, failure to resolve symptoms, recurrence of hernia, ischemic orchitis, chronic pain syndrome or neuroma, were discussed again. The likelihood of improving the patient's symptoms with return to their baseline status is good.  The patient and/or family concurred with the proposed plan, giving informed consent.  The patient was taken to Operating Room, identified as Jake Diaz and the procedure verified as Laparoscopic Inguinal Hernia Repair. Laterality confirmed.  A Time Out was held and the above information confirmed.  Prior to the induction of general anesthesia, antibiotic prophylaxis was administered. VTE prophylaxis was in place. General endotracheal anesthesia was then administered and tolerated well. After the induction, the abdomen was prepped with Chloraprep and draped in the sterile fashion. The patient was positioned in the supine position.  Local anesthetic  was injected into the skin near the umbilicus and an incision made. An incision was made and dissection down to the rectus fascia was performed. The fascia was incised and the muscle retracted laterally. The Covidien dissecting balloon was placed followed by the structural balloon. The preperitoneal space was insufflated and under direct vision 2 midline 5 mm ports  were placed.  Dissection was performed to delineate Cooper's ligament and the lateral extent of dissection was determined on each side. The nerve on the lateral abdominal wall was identified and kept in view at all times. The cord was skeletonized of the indirect sac and cord lipoma which was retracted cephalad , the sac was large and was divided with cautery and a loop PDS was used to close the defect.  Once this was complete, a 3D mesh by BArd was placed into the preperitoneal space. Securestrap used to fix the hernia to coopers and pubic bone, Only two tacks were used. Once assuring that the hernia was completely repaired and adequately covered, the preperitoneal space was desufflated under direct vision. There was no sign of peritoneal rent and no sign of bowel intrusion towards the mesh.  Once assuring that hemostasis was adequate the ports were removed and a figure-of-eight 0 Vicryl suture was placed at the fascial edges. 4-0 subcuticular Monocryl was used at all skin edges. Dermabond was used to coat the incisions  Patient tolerated the procedure well. There were no complications. He was taken to the recovery room in stable condition.               Sterling Bigiego Pabon, MD, FACS

## 2016-06-20 ENCOUNTER — Encounter: Payer: Self-pay | Admitting: Surgery

## 2016-06-20 LAB — HIV ANTIBODY (ROUTINE TESTING W REFLEX): HIV SCREEN 4TH GENERATION: NONREACTIVE

## 2016-07-03 ENCOUNTER — Ambulatory Visit (INDEPENDENT_AMBULATORY_CARE_PROVIDER_SITE_OTHER): Payer: 59 | Admitting: Surgery

## 2016-07-03 ENCOUNTER — Encounter: Payer: Self-pay | Admitting: Surgery

## 2016-07-03 VITALS — BP 149/81 | HR 72 | Temp 98.2°F | Ht 67.0 in | Wt 161.4 lb

## 2016-07-03 DIAGNOSIS — Z09 Encounter for follow-up examination after completed treatment for conditions other than malignant neoplasm: Secondary | ICD-10-CM

## 2016-07-03 NOTE — Progress Notes (Signed)
07/03/2016  HPI: Patient is status post laparoscopic preperitoneal left inguinal hernia repair with mesh by Dr. Everlene Farrier on 3/20. He presents today for postop follow-up. Reports that he's been doing well with no significant swelling or pain over the incisions. Denies any new bulging or hernia. Worse that he did try doing pushups yesterday and had some increased soreness in the lower abdomen and realizes that he shouldn't have done that. Otherwise denies any fevers, chills, chest pain, shortness of breath. Reports a good appetite with no nausea or vomiting and no constipation.  Vital signs: BP (!) 149/81   Pulse 72   Temp 98.2 F (36.8 C) (Oral)   Ht  (1.702 m)   Wt 73.2 kg (161 lb 6.4 oz)   BMI 25.28 kg/m    Physical Exam: Constitutional: No acute distress Abdomen:  Soft, nondistended, nontender to palpation. All 3 incisions are clean dry and intact with no evidence of infection. There is no recurrence of hernia on the left side. There is no swelling or ecchymosis.  Assessment/Plan: 41 year old male status post laparoscopic left inguinal hernia repair.  -Have instructed the patient to continue restriction of no heavy lifting or pushing of more than 10-15 pounds until 4/20 which will complete a month after the surgery. Resume regular activities after that. -Patient may submerge the wounds at this time pool or tub as desired. -Patient may follow-up with [place] on an as needed basis.   Howie Ill, MD [facility]

## 2016-07-03 NOTE — Patient Instructions (Addendum)
You may resume normal activities on 07/20/16, until then please do not lift anything over 15 pounds or do any strenuous exercises.  Please call our office if you have questions or concerns.

## 2018-03-14 IMAGING — CT CT ABD-PELV W/ CM
2 of 8 series · 14 of 46 positions shown, 19 images · IV contrast (APPLIED)
Comparison: None.

CLINICAL DATA: Left groin pain, hernia, scrotal swelling

EXAM:
CT ABDOMEN AND PELVIS WITH CONTRAST
TECHNIQUE: Multidetector CT imaging of the abdomen and pelvis was performed
using the standard protocol following bolus administration of
intravenous contrast.
CONTRAST:  100mL [id number] IOPAMIDOL (XH6KQR-NLL) INJECTION 61%

[Series 2: routine abd/pel with · axial · 0.77mm/px · z∈[-471,-81]mm · 11 of 90 slices shown, 16 images]
[im 6/90  soft-tissue]
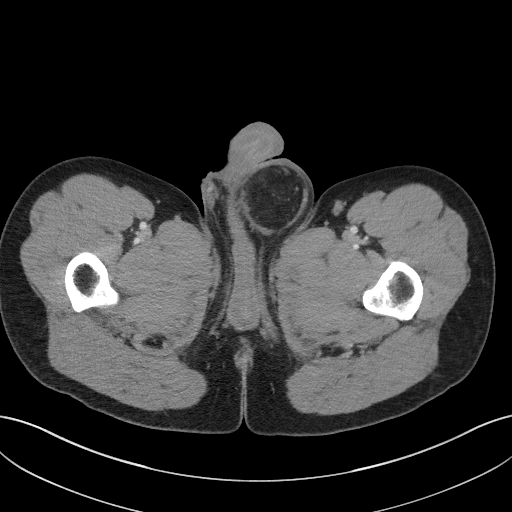
[im 6/90  bone]
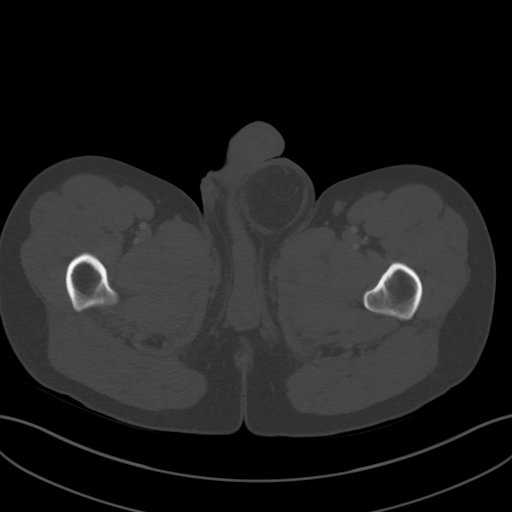
[im 18/90  soft-tissue]
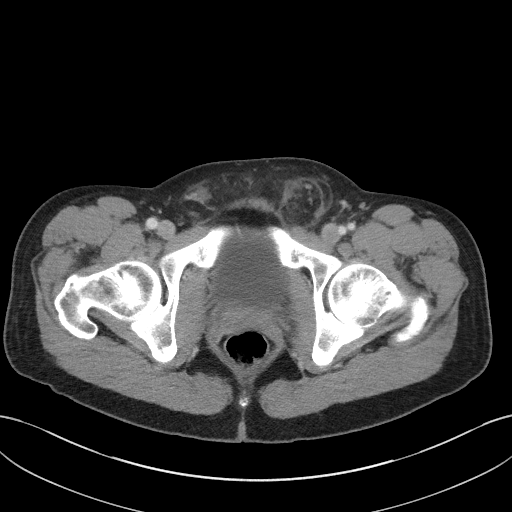
[im 24/90  soft-tissue]
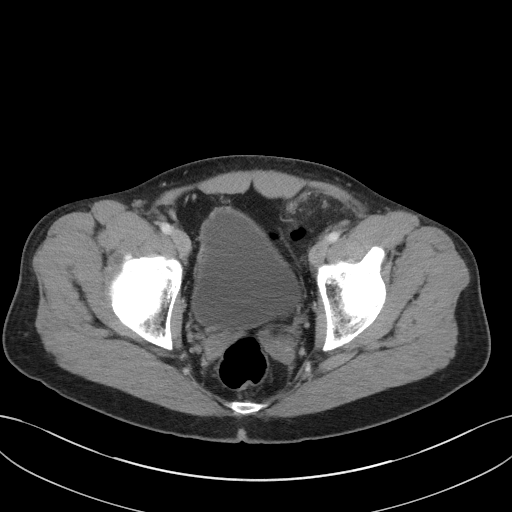
[im 30/90  soft-tissue]
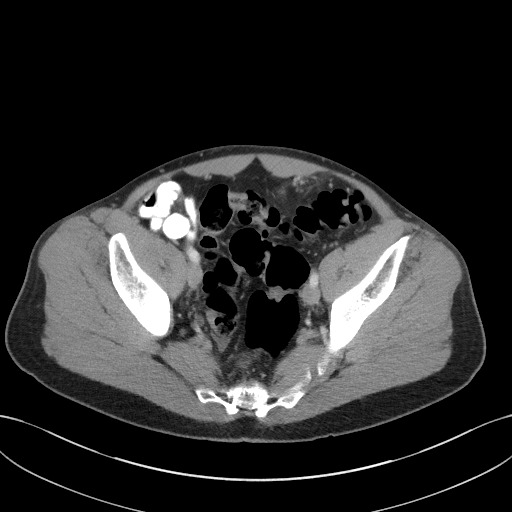
[im 42/90  soft-tissue]
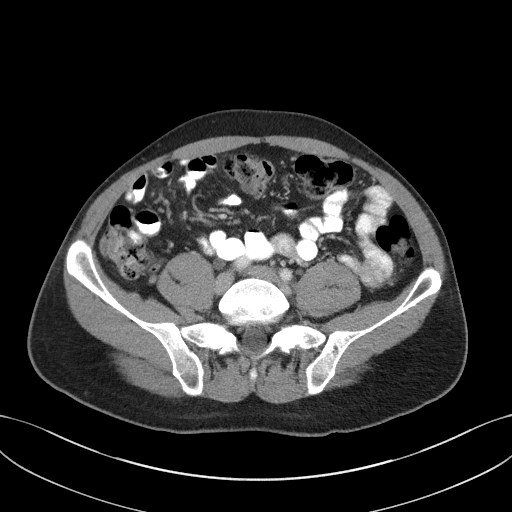
[im 48/90  soft-tissue]
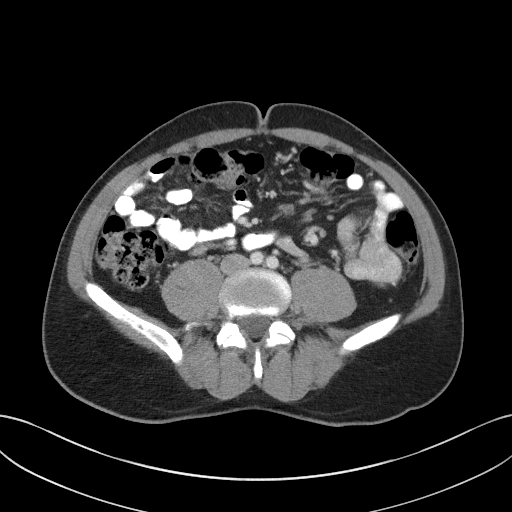
[im 60/90  soft-tissue]
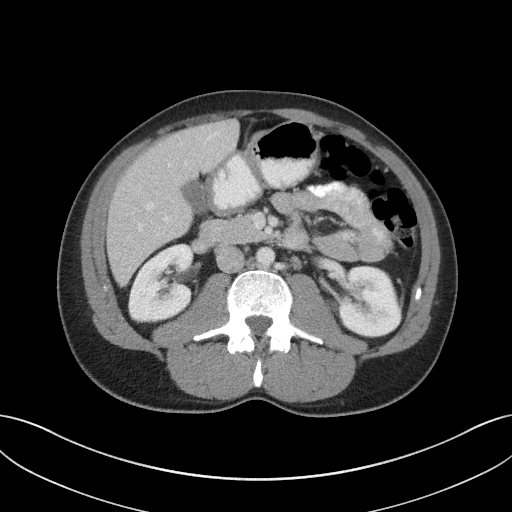
[im 66/90  soft-tissue]
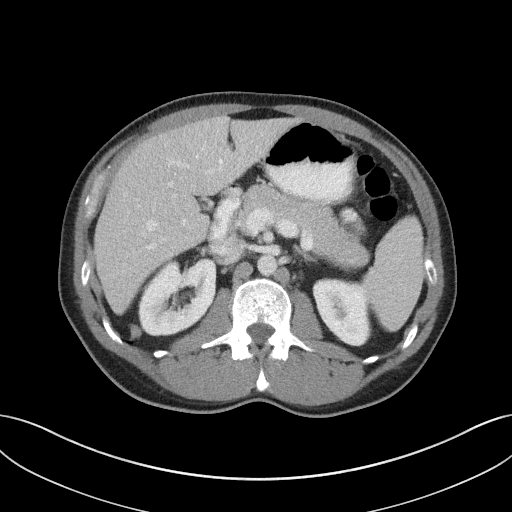
[im 66/90  lung]
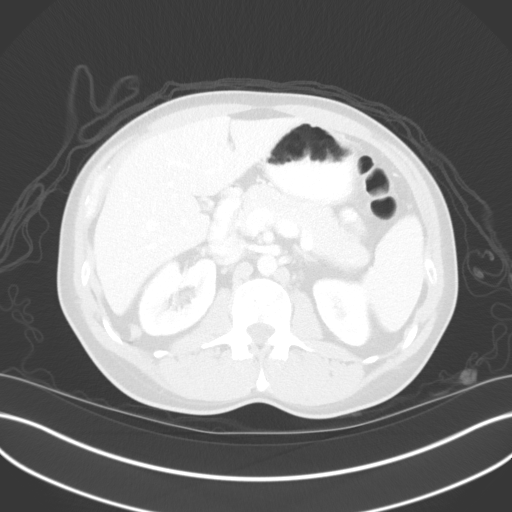
[im 72/90  soft-tissue]
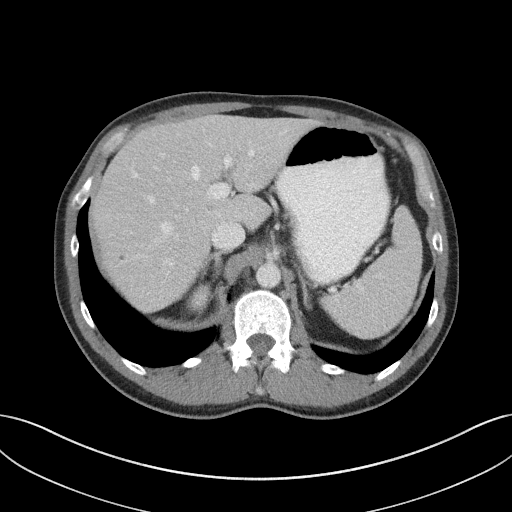
[im 72/90  lung]
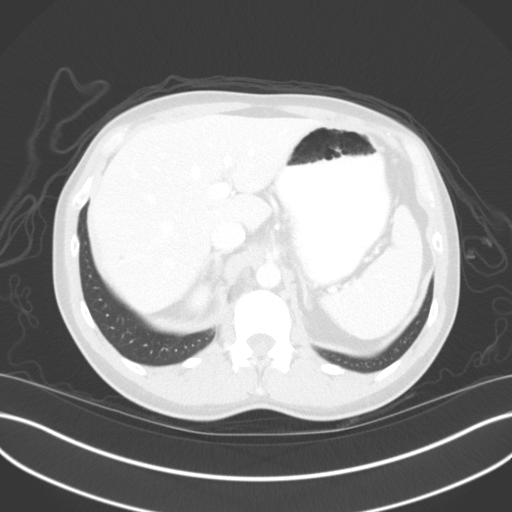
[im 72/90  bone]
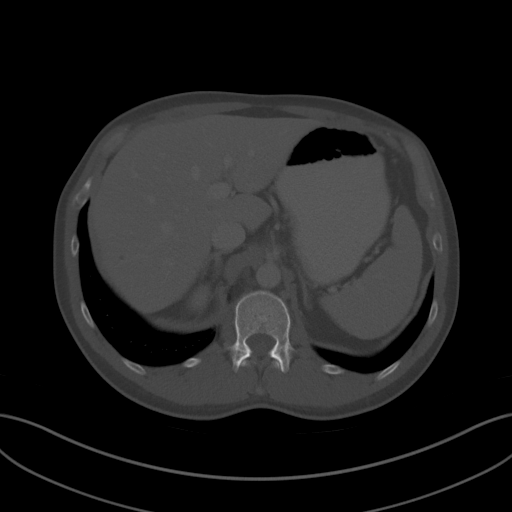
[im 78/90  lung]
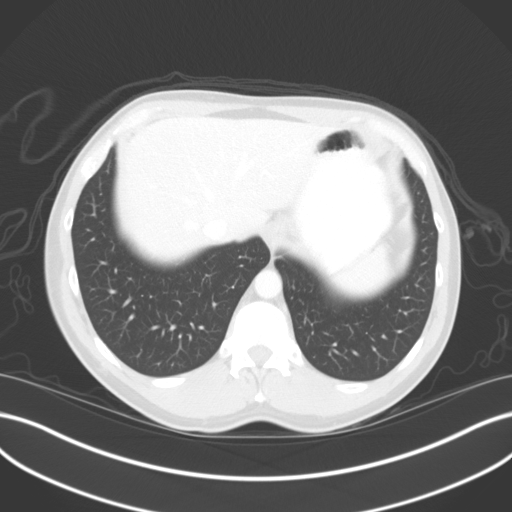
[im 84/90  soft-tissue]
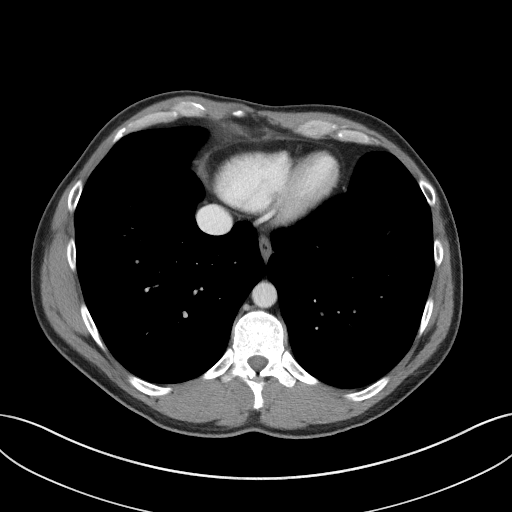
[im 84/90  lung]
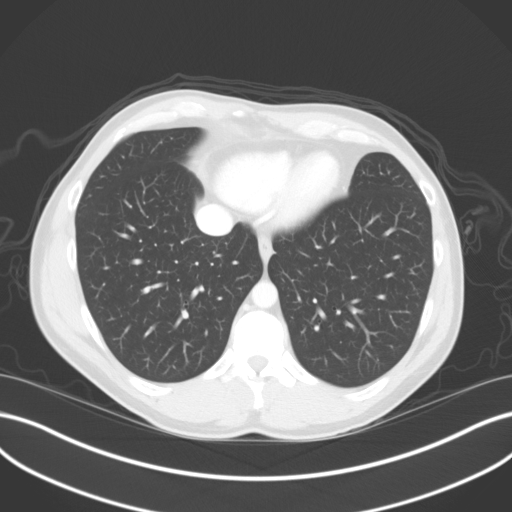

[Series 7: coronal st · coronal · 0.79mm/px · 3 of 82 slices shown]
[im 17/82  soft-tissue]
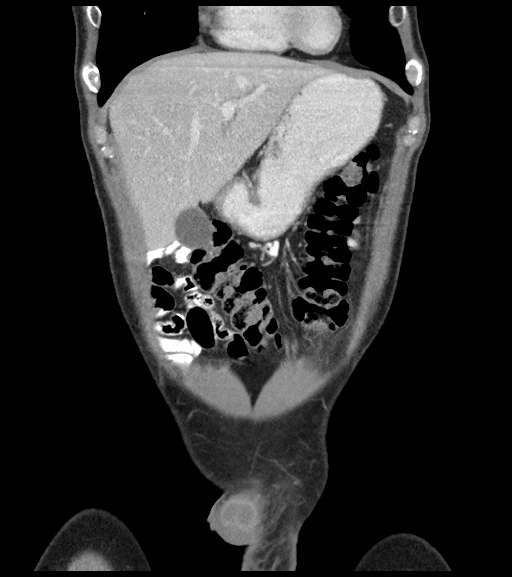
[im 33/82  soft-tissue]
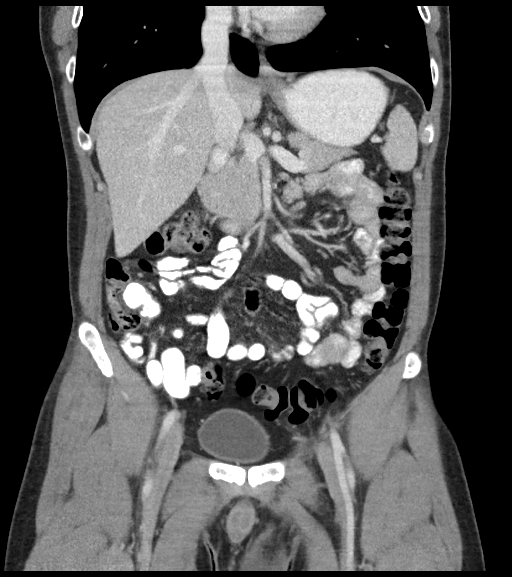
[im 49/82  soft-tissue]
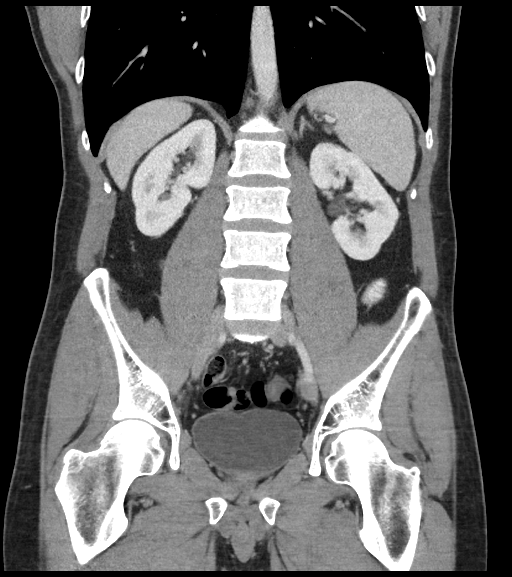

[14 of 46 positions shown; findings below may reference images not displayed]

FINDINGS: Lower chest: Lung bases are clear.

Hepatobiliary: Liver is within normal limits, noting a 4 mm probable
cyst in segment 8 (series 2/image 19).

Gallbladder is unremarkable. No intrahepatic or extrahepatic ductal
dilatation.

Pancreas: Within normal limits.

Spleen: Within normal limits.

Adrenals/Urinary Tract: Adrenal glands are within normal limits.

Kidneys are within normal limits.  No hydronephrosis.

Bladder is within normal limits.

Stomach/Bowel: Stomach is within normal limits.

No evidence of bowel obstruction.

Normal appendix (series 2/ image 53).

Vascular/Lymphatic: No evidence of abdominal aortic aneurysm.

Circumaortic left renal vein.

No suspicious abdominopelvic lymphadenopathy.

Reproductive: Prostate is unremarkable.

Other: No abdominopelvic ascites.

Moderate to large fat containing left inguinal/ scrotal hernia.
Trace fluid within the hernia as it enters the left scrotum (series
2/image 90), incompletely visualized.

Musculoskeletal: Visualized osseous structures are within normal
limits.
IMPRESSION: Moderate to large fat containing left inguinal/ scrotal hernia,
incompletely visualized.

## 2019-03-03 HISTORY — PX: VASECTOMY: SHX75

## 2019-08-21 ENCOUNTER — Ambulatory Visit: Payer: Self-pay | Admitting: Family Medicine

## 2019-09-10 ENCOUNTER — Other Ambulatory Visit: Payer: Self-pay

## 2019-09-11 ENCOUNTER — Ambulatory Visit (INDEPENDENT_AMBULATORY_CARE_PROVIDER_SITE_OTHER): Payer: Managed Care, Other (non HMO) | Admitting: Family Medicine

## 2019-09-11 ENCOUNTER — Encounter: Payer: Self-pay | Admitting: Family Medicine

## 2019-09-11 VITALS — BP 112/80 | HR 83 | Temp 97.8°F | Ht 67.0 in | Wt 161.8 lb

## 2019-09-11 DIAGNOSIS — Z0000 Encounter for general adult medical examination without abnormal findings: Secondary | ICD-10-CM | POA: Diagnosis not present

## 2019-09-11 DIAGNOSIS — Z23 Encounter for immunization: Secondary | ICD-10-CM

## 2019-09-11 NOTE — Progress Notes (Signed)
New Patient Office Visit  Subjective:  Patient ID: Jake Diaz, male    DOB: [birth year]  Age: 44 y.o. MRN: [id number]  CC:  Chief Complaint  Patient presents with  . Establish Care    Pt here today for physical. Pt is fasting for lab work today.  He has a physical form to fill out for work today. Pt would like to discuss mendication for traveling.  pt is due for a Tdap    HPI Jake Diaz presents for establishment of care and for complete physical exam.  He is fasting today and in good health as far as he knows.  He does not drink alcohol smoke or use illicit drugs.  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  He is active physically by riding a stationary bike but agrees that he could do more.  Has had access to regular dental care.  Has had the job [employer] vaccine for [employer].  Family history of gout, hypertension and diverticulosis.  Patient has no issues referable to the gastro intestinal tract.  He is planning travel to [place] to his wife's home.  Past Medical History:  Diagnosis Date  . Incarcerated inguinal hernia     Past Surgical History:  Procedure Laterality Date  . INGUINAL HERNIA REPAIR Left 06/19/2016   Procedure: LAPAROSCOPIC INGUINAL HERNIA;  Surgeon: Leafy Ro, MD;  Location: [facility] ORS;  Service: General;  Laterality: Left;  . LAPAROSCOPIC INGUINAL HERNIA REPAIR  06/19/2016  . VASECTOMY  03/2019    Family History  Problem Relation Age of Onset  . Hypertension Mother   . Diverticulitis Brother     Social History   Socioeconomic History  . Marital status: Married    Spouse name: Not on file  . Number of children: Not on file  . Years of education: Not on file  . Highest education level: Not on file  Occupational History  . Not on file  Tobacco Use  . Smoking status: Never Smoker  . Smokeless tobacco: Never Used  Substance and Sexual  Activity  . Alcohol use: No  . Drug use: No  . Sexual activity: Not on file  Other Topics Concern  . Not on file  Social History Narrative  . Not on file   Social Determinants of Health   Financial Resource Strain:   . Difficulty of Paying Living Expenses:   Food Insecurity:   . Worried About Programme researcher, broadcasting/film/video in the Last Year:   . Barista in the Last Year:   Transportation Needs:   . Freight forwarder (Medical):   Marland Kitchen Lack of Transportation (Non-Medical):   Physical Activity:   . Days of Exercise per Week:   . Minutes of Exercise per Session:   Stress:   . Feeling of Stress :   Social Connections:   . Frequency of Communication with Friends and Family:   . Frequency of Social Gatherings with Friends and Family:   . Attends Religious Services:   . Active Member of Clubs or Organizations:   . Attends Banker Meetings:   Marland Kitchen Marital Status:   Intimate Partner Violence:   . Fear of Current or Ex-Partner:   . Emotionally Abused:   Marland Kitchen Physically Abused:   . Sexually Abused:     ROS Review of Systems  Constitutional: Negative.   HENT: Negative.  Eyes: Negative for photophobia and visual disturbance.  Respiratory: Negative.   Cardiovascular: Negative.   Gastrointestinal: Negative for abdominal pain, anal bleeding, blood in stool, nausea and vomiting.  Endocrine: Negative for polyphagia and polyuria.  Genitourinary: Negative.   Musculoskeletal: Negative for gait problem and joint swelling.  Skin: Negative for pallor and rash.  Allergic/Immunologic: Negative for immunocompromised state.  Neurological: Negative for light-headedness and numbness.  Hematological: Does not bruise/bleed easily.    Objective:   Today's Vitals: BP 112/80 (BP Location: Left Arm, Patient Position: Sitting, Cuff Size: Normal)   Pulse 83   Temp 97.8 F (36.6 C) (Temporal)   Ht 5\' 7"  (1.702 m)   Wt 161 lb 12.8 oz (73.4 kg)   SpO2 97%   BMI 25.34 kg/m   Physical  Exam Vitals and nursing note reviewed.  Constitutional:      General: He is not in acute distress.    Appearance: Normal appearance. He is normal weight. He is not ill-appearing, toxic-appearing or diaphoretic.  HENT:     Head: Normocephalic and atraumatic.     Right Ear: Tympanic membrane, ear canal and external ear normal. There is no impacted cerumen.     Left Ear: Tympanic membrane, ear canal and external ear normal. There is no impacted cerumen.     Mouth/Throat:     Mouth: Mucous membranes are moist.     Pharynx: Oropharynx is clear. No oropharyngeal exudate or posterior oropharyngeal erythema.  Eyes:     General: No scleral icterus.       Right eye: No discharge.        Left eye: No discharge.     Extraocular Movements: Extraocular movements intact.     Conjunctiva/sclera: Conjunctivae normal.     Pupils: Pupils are equal, round, and reactive to light.  Cardiovascular:     Rate and Rhythm: Normal rate and regular rhythm.     Pulses: Normal pulses.     Heart sounds: Normal heart sounds.  Pulmonary:     Effort: Pulmonary effort is normal.     Breath sounds: Normal breath sounds.  Abdominal:     General: Abdomen is flat. Bowel sounds are normal. There is no distension.     Palpations: Abdomen is soft. There is no mass.     Tenderness: There is no abdominal tenderness. There is no guarding or rebound.     Hernia: No hernia is present. There is no hernia in the left inguinal area or right inguinal area.  Genitourinary:    Penis: Normal and circumcised. No hypospadias, erythema, tenderness, discharge, swelling or lesions.      Testes:        Right: Mass, tenderness or swelling not present. Right testis is descended.        Left: Mass, tenderness or swelling not present. Left testis is descended.  Musculoskeletal:     Cervical back: Normal range of motion and neck supple. No rigidity or tenderness.     Right lower leg: No edema.     Left lower leg: No edema.  Lymphadenopathy:       Cervical: No cervical adenopathy.     Lower Body: No right inguinal adenopathy. No left inguinal adenopathy.  Skin:    General: Skin is warm and dry.  Neurological:     Mental Status: He is alert and oriented to person, place, and time.  Psychiatric:        Mood and Affect: Mood normal.  Behavior: Behavior normal.     Assessment & Plan:   Problem List Items Addressed This Visit    None    Visit Diagnoses    Healthcare maintenance    -  Primary   Relevant Orders   CBC   Comprehensive metabolic panel   Hepatitis C antibody   Lipid panel   Urinalysis, Routine w reflex microscopic   Ambulatory referral to Travel Clinic   Tdap vaccine greater than or equal to 7yo IM   Hemoglobin A1c   Uric acid      No outpatient encounter medications on file as of 09/11/2019.   No facility-administered encounter medications on file as of 09/11/2019.    Follow-up: Return in about 1 year (around 09/10/2020), or if symptoms worsen or fail to improve.  Given information on health maintenance and disease prevention.  Referral to travel clinic. Mliss Sax, MD

## 2019-09-11 NOTE — Patient Instructions (Addendum)
Health Maintenance Due  Topic Date Due  . Hepatitis C Screening  Never done  . TETANUS/TDAP  Never done    No flowsheet data found.  Health Maintenance, Male Adopting a healthy lifestyle and getting preventive care are important in promoting health and wellness. Ask your health care provider about:  The right schedule for you to have regular tests and exams.  Things you can do on your own to prevent diseases and keep yourself healthy. What should I know about diet, weight, and exercise? Eat a healthy diet   Eat a diet that includes plenty of vegetables, fruits, low-fat dairy products, and lean protein.  Do not eat a lot of foods that are high in solid fats, added sugars, or sodium. Maintain a healthy weight Body mass index (BMI) is a measurement that can be used to identify possible weight problems. It estimates body fat based on height and weight. Your health care provider can help determine your BMI and help you achieve or maintain a healthy weight. Get regular exercise Get regular exercise. This is one of the most important things you can do for your health. Most adults should:  Exercise for at least 150 minutes each week. The exercise should increase your heart rate and make you sweat (moderate-intensity exercise).  Do strengthening exercises at least twice a week. This is in addition to the moderate-intensity exercise.  Spend less time sitting. Even light physical activity can be beneficial. Watch cholesterol and blood lipids Have your blood tested for lipids and cholesterol at 45 years of age, then have this test every 5 years. You may need to have your cholesterol levels checked more often if:  Your lipid or cholesterol levels are high.  You are older than 45 years of age.  You are at high risk for heart disease. What should I know about cancer screening? Many types of cancers can be detected early and may often be prevented. Depending on your health history and family  history, you may need to have cancer screening at various ages. This may include screening for:  Colorectal cancer.  Prostate cancer.  Skin cancer.  Lung cancer. What should I know about heart disease, diabetes, and high blood pressure? Blood pressure and heart disease  High blood pressure causes heart disease and increases the risk of stroke. This is more likely to develop in people who have high blood pressure readings, are of African descent, or are overweight.  Talk with your health care provider about your target blood pressure readings.  Have your blood pressure checked: ? Every 3-5 years if you are 57-52 years of age. ? Every year if you are 36 years old or older.  If you are between the ages of 60 and 30 and are a current or former smoker, ask your health care provider if you should have a one-time screening for abdominal aortic aneurysm (AAA). Diabetes Have regular diabetes screenings. This checks your fasting blood sugar level. Have the screening done:  Once every three years after age 32 if you are at a normal weight and have a low risk for diabetes.  More often and at a younger age if you are overweight or have a high risk for diabetes. What should I know about preventing infection? Hepatitis B If you have a higher risk for hepatitis B, you should be screened for this virus. Talk with your health care provider to find out if you are at risk for hepatitis B infection. Hepatitis C Blood testing is  recommended for:  Everyone born from [birth year] through 1966.  Anyone with known risk factors for hepatitis C. Sexually transmitted infections (STIs)  You should be screened each year for STIs, including gonorrhea and chlamydia, if: ? You are sexually active and are younger than 45 years of age. ? You are older than 45 years of age and your health care provider tells you that you are at risk for this type of infection. ? Your sexual activity has changed since you were last  screened, and you are at increased risk for chlamydia or gonorrhea. Ask your health care provider if you are at risk.  Ask your health care provider about whether you are at high risk for HIV. Your health care provider may recommend a prescription medicine to help prevent HIV infection. If you choose to take medicine to prevent HIV, you should first get tested for HIV. You should then be tested every 3 months for as long as you are taking the medicine. Follow these instructions at home: Lifestyle  Do not use any products that contain nicotine or tobacco, such as cigarettes, e-cigarettes, and chewing tobacco. If you need help quitting, ask your health care provider.  Do not use street drugs.  Do not share needles.  Ask your health care provider for help if you need support or information about quitting drugs. Alcohol use  Do not drink alcohol if your health care provider tells you not to drink.  If you drink alcohol: ? Limit how much you have to 0-2 drinks a day. ? Be aware of how much alcohol is in your drink. In the [place] one drink equals one 12 oz bottle of beer (355 mL), one 5 oz glass of wine (148 mL), or one 1 oz glass of hard liquor (44 mL). General instructions  Schedule regular health, dental, and eye exams.  Stay current with your vaccines.  Tell your health care provider if: ? You often feel depressed. ? You have ever been abused or do not feel safe at home. Summary  Adopting a healthy lifestyle and getting preventive care are important in promoting health and wellness.  Follow your health care provider's instructions about healthy diet, exercising, and getting tested or screened for diseases.  Follow your health care provider's instructions on monitoring your cholesterol and blood pressure. This information is not intended to replace advice given to you by your health care provider. Make sure you discuss any questions you have with your health care provider. Document  Revised: 03/12/2018 Document Reviewed: 03/12/2018 Elsevier Patient Education  2020 Elsevier Inc.  Preventive Care 46-23 Years Old, Male Preventive care refers to lifestyle choices and visits with your health care provider that can promote health and wellness. This includes:  A yearly physical exam. This is also called an annual well check.  Regular dental and eye exams.  Immunizations.  Screening for certain conditions.  Healthy lifestyle choices, such as eating a healthy diet, getting regular exercise, not using drugs or products that contain nicotine and tobacco, and limiting alcohol use. What can I expect for my preventive care visit? Physical exam Your health care provider will check:  Height and weight. These may be used to calculate body mass index (BMI), which is a measurement that tells if you are at a healthy weight.  Heart rate and blood pressure.  Your skin for abnormal spots. Counseling Your health care provider may ask you questions about:  Alcohol, tobacco, and drug use.  Emotional well-being.  Home and relationship  well-being.  Sexual activity.  Eating habits.  Work and work Statistician. What immunizations do I need?  Influenza (flu) vaccine  This is recommended every year. Tetanus, diphtheria, and pertussis (Tdap) vaccine  You may need a Td booster every 10 years. Varicella (chickenpox) vaccine  You may need this vaccine if you have not already been vaccinated. Zoster (shingles) vaccine  You may need this after age 47. Measles, mumps, and rubella (MMR) vaccine  You may need at least one dose of MMR if you were born in 1957 or later. You may also need a second dose. Pneumococcal conjugate (PCV13) vaccine  You may need this if you have certain conditions and were not previously vaccinated. Pneumococcal polysaccharide (PPSV23) vaccine  You may need one or two doses if you smoke cigarettes or if you have certain conditions. Meningococcal  conjugate (MenACWY) vaccine  You may need this if you have certain conditions. Hepatitis A vaccine  You may need this if you have certain conditions or if you travel or work in places where you may be exposed to hepatitis A. Hepatitis B vaccine  You may need this if you have certain conditions or if you travel or work in places where you may be exposed to hepatitis B. Haemophilus influenzae type b (Hib) vaccine  You may need this if you have certain risk factors. Human papillomavirus (HPV) vaccine  If recommended by your health care provider, you may need three doses over 6 months. You may receive vaccines as individual doses or as more than one vaccine together in one shot (combination vaccines). Talk with your health care provider about the risks and benefits of combination vaccines. What tests do I need? Blood tests  Lipid and cholesterol levels. These may be checked every 5 years, or more frequently if you are over 31 years old.  Hepatitis C test.  Hepatitis B test. Screening  Lung cancer screening. You may have this screening every year starting at age 39 if you have a 30-pack-year history of smoking and currently smoke or have quit within the past 15 years.  Prostate cancer screening. Recommendations will vary depending on your family history and other risks.  Colorectal cancer screening. All adults should have this screening starting at age 68 and continuing until age 44. Your health care provider may recommend screening at age 30 if you are at increased risk. You will have tests every 1-10 years, depending on your results and the type of screening test.  Diabetes screening. This is done by checking your blood sugar (glucose) after you have not eaten for a while (fasting). You may have this done every 1-3 years.  Sexually transmitted disease (STD) testing. Follow these instructions at home: Eating and drinking  Eat a diet that includes fresh fruits and vegetables, whole  grains, lean protein, and low-fat dairy products.  Take vitamin and mineral supplements as recommended by your health care provider.  Do not drink alcohol if your health care provider tells you not to drink.  If you drink alcohol: ? Limit how much you have to 0-2 drinks a day. ? Be aware of how much alcohol is in your drink. In the [place] one drink equals one 12 oz bottle of beer (355 mL), one 5 oz glass of wine (148 mL), or one 1 oz glass of hard liquor (44 mL). Lifestyle  Take daily care of your teeth and gums.  Stay active. Exercise for at least 30 minutes on 5 or more days each week.  Do not use any products that contain nicotine or tobacco, such as cigarettes, e-cigarettes, and chewing tobacco. If you need help quitting, ask your health care provider.  If you are sexually active, practice safe sex. Use a condom or other form of protection to prevent STIs (sexually transmitted infections).  Talk with your health care provider about taking a low-dose aspirin every day starting at age 45. What's next?  Go to your health care provider once a year for a well check visit.  Ask your health care provider how often you should have your eyes and teeth checked.  Stay up to date on all vaccines. This information is not intended to replace advice given to you by your health care provider. Make sure you discuss any questions you have with your health care provider. Document Revised: 03/13/2018 Document Reviewed: 03/13/2018 Elsevier Patient Education  2020 [employer].

## 2019-09-12 LAB — URINALYSIS, ROUTINE W REFLEX MICROSCOPIC
Bilirubin, UA: NEGATIVE
Glucose, UA: NEGATIVE
Ketones, UA: NEGATIVE
Leukocytes,UA: NEGATIVE
Nitrite, UA: NEGATIVE
Protein,UA: NEGATIVE
RBC, UA: NEGATIVE
Specific Gravity, UA: 1.026 (ref 1.005–1.030)
Urobilinogen, Ur: 0.2 mg/dL (ref 0.2–1.0)
pH, UA: 5.5 (ref 5.0–7.5)

## 2019-09-12 LAB — COMPREHENSIVE METABOLIC PANEL
ALT: 12 IU/L (ref 0–44)
AST: 10 IU/L (ref 0–40)
Albumin/Globulin Ratio: 2 (ref 1.2–2.2)
Albumin: 4.5 g/dL (ref 4.0–5.0)
Alkaline Phosphatase: 68 IU/L (ref 48–121)
BUN/Creatinine Ratio: 19 (ref 9–20)
BUN: 17 mg/dL (ref 6–24)
Bilirubin Total: 0.5 mg/dL (ref 0.0–1.2)
CO2: 25 mmol/L (ref 20–29)
Calcium: 9.5 mg/dL (ref 8.7–10.2)
Chloride: 103 mmol/L (ref 96–106)
Creatinine, Ser: 0.91 mg/dL (ref 0.76–1.27)
GFR calc Af Amer: 117 mL/min/{1.73_m2} (ref >59)
GFR calc non Af Amer: 101 mL/min/{1.73_m2} (ref >59)
Globulin, Total: 2.3 g/dL (ref 1.5–4.5)
Glucose: 84 mg/dL (ref 65–99)
Potassium: 4.2 mmol/L (ref 3.5–5.2)
Sodium: 141 mmol/L (ref 134–144)
Total Protein: 6.8 g/dL (ref 6.0–8.5)

## 2019-09-12 LAB — URIC ACID: Uric Acid: 6.6 mg/dL (ref 3.8–8.4)

## 2019-09-12 LAB — CBC
Hematocrit: 48.2 % (ref 37.5–51.0)
Hemoglobin: 16 g/dL (ref 13.0–17.7)
MCH: 28.3 pg (ref 26.6–33.0)
MCHC: 33.2 g/dL (ref 31.5–35.7)
MCV: 85 fL (ref 79–97)
Platelets: 248 10*3/uL (ref 150–450)
RBC: 5.65 x10E6/uL (ref 4.14–5.80)
RDW: 13 % (ref 11.6–15.4)
WBC: 6.5 10*3/uL (ref 3.4–10.8)

## 2019-09-12 LAB — HEPATITIS C ANTIBODY: Hep C Virus Ab: <0.1 s/co ratio (ref 0.0–0.9)

## 2019-09-12 LAB — LIPID PANEL
Chol/HDL Ratio: 3.1 ratio (ref 0.0–5.0)
Cholesterol, Total: 178 mg/dL (ref 100–199)
HDL: 58 mg/dL (ref >39)
LDL Chol Calc (NIH): 106 mg/dL — ABNORMAL HIGH (ref 0–99)
Triglycerides: 73 mg/dL (ref 0–149)
VLDL Cholesterol Cal: 14 mg/dL (ref 5–40)

## 2019-09-12 LAB — HEMOGLOBIN A1C
Est. average glucose Bld gHb Est-mCnc: 105 mg/dL
Hgb A1c MFr Bld: 5.3 % (ref 4.8–5.6)

## 2019-09-14 ENCOUNTER — Telehealth: Payer: Self-pay | Admitting: Family Medicine

## 2019-09-14 NOTE — Telephone Encounter (Signed)
I received call back that pt does not need referral faxed. Pt can call on his own to get an appt scheduled and does not have to be an employee of [employer]. Attempted calling pt, no answer at this time.  [facility] at [place] [phone] ph [address] [place], [place] [address]

## 2019-09-14 NOTE — Telephone Encounter (Signed)
[place] website still listing [facility] for [facility] and Employee [facility]. I've never referred to them and do not have a fax #. I do not know if they will see pts w/o [place] ins. I have left a msg at [employer] [facility]. Waiting on return phone call.

## 2019-09-14 NOTE — Telephone Encounter (Signed)
Jake Diaz from [facility] calling about referral, She said: We are not having travel clinic please send the patient to [employer] [facility]    Phone [phone]. Please Advise. She also said they haven't done the travel clinic for about a year and they are not sure yet when it will start back up and just wanted to let us know for future reference.

## 2019-09-14 NOTE — Telephone Encounter (Signed)
ID is no longer doing the travel clinic. 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

## 2019-09-15 NOTE — Telephone Encounter (Signed)
Pt aware/thx dmf 

## 2019-11-24 ENCOUNTER — Telehealth: Payer: Self-pay | Admitting: Family Medicine

## 2019-11-24 NOTE — Telephone Encounter (Signed)
Patient called and stated that he left paperwork for Dr. Doreene Burke to complete and fax back at his 6/11 appointment and wanted to see if the forms had been completed and faxed back, please advise. CB is [phone]

## 2019-11-25 NOTE — Telephone Encounter (Signed)
Called patient to inform that form was faxed. No answer LMTCB

## 2019-11-25 NOTE — Telephone Encounter (Signed)
Informed patient that form was faxed. Patient wanted to see if he can pick up a copy of form, please advise. CB is [phone]

## 2019-11-25 NOTE — Telephone Encounter (Signed)
Form up front ready for pick up

## 2021-12-28 ENCOUNTER — Ambulatory Visit: Payer: Managed Care, Other (non HMO) | Admitting: Family Medicine

## 2021-12-28 ENCOUNTER — Telehealth: Payer: Self-pay | Admitting: Family Medicine

## 2021-12-28 NOTE — Telephone Encounter (Signed)
Pt called in to cancel OV 9/27 with Dr. Ethelene Hal due to another appointment (cancellation within 24 hours). This is his first, has been rescheduled for tomorrow (9/28)

## 2021-12-28 NOTE — Telephone Encounter (Signed)
Late cancel/1st no show, fee waived, letter sent 

## 2021-12-29 ENCOUNTER — Ambulatory Visit: Payer: Managed Care, Other (non HMO) | Admitting: Family Medicine

## 2021-12-29 ENCOUNTER — Encounter: Payer: Self-pay | Admitting: Family Medicine

## 2021-12-29 VITALS — BP 136/72 | HR 84 | Temp 97.4°F | Ht 67.0 in | Wt 165.4 lb

## 2021-12-29 DIAGNOSIS — Z1211 Encounter for screening for malignant neoplasm of colon: Secondary | ICD-10-CM

## 2021-12-29 DIAGNOSIS — M7918 Myalgia, other site: Secondary | ICD-10-CM | POA: Diagnosis not present

## 2021-12-29 DIAGNOSIS — R059 Cough, unspecified: Secondary | ICD-10-CM | POA: Diagnosis not present

## 2021-12-29 DIAGNOSIS — Z23 Encounter for immunization: Secondary | ICD-10-CM

## 2021-12-29 MED ORDER — PREDNISONE 10 MG PO TABS: 10.0000 mg | ORAL_TABLET | Freq: Two times a day (BID) | ORAL | 0 refills | Status: AC

## 2021-12-29 MED ORDER — PREDNISONE 10 MG PO TABS: 10.0000 mg | ORAL_TABLET | Freq: Two times a day (BID) | ORAL | 0 refills | Status: DC

## 2021-12-29 NOTE — Progress Notes (Signed)
   Established Patient Office Visit  Subjective   Patient ID: Jake Diaz, male    DOB: Jul 18, 1975  Age: 46 y.o. MRN: [id number]  Chief Complaint  Patient presents with   Cough    Dry cough x 2 weeks no improvement.     Cough Pertinent negatives include no eye redness, heartburn, hemoptysis, myalgias, rash, sore throat, shortness of breath or wheezing.   for evaluation of a 2-week history of a dry nonproductive cough without wheezing or history of reactive airway disease.  Denies postnasal drip or GERD symptoms.  Denies sneezing itchy watery eyes ears nose or throat.  He does not smoke or use illicit drugs.  There is no family history of asthma.  Reports pain in his right buttock.  He works from home and sits a great deal of the day.  No regular exercise.  He has changed chairs and this is helped some.  The pain is nonradiating.  There is no numbness tingling or weakness.  There is no change in his bowel or bladder function.  He is interested in going for his first colonoscopy.  Stress levels are no more than usual.    Review of Systems  Constitutional: Negative.   HENT: Negative.  Negative for congestion, sinus pain and sore throat.   Eyes:  Negative for blurred vision, discharge and redness.  Respiratory:  Positive for cough. Negative for hemoptysis, sputum production, shortness of breath, wheezing and stridor.   Cardiovascular: Negative.   Gastrointestinal:  Negative for abdominal pain, heartburn, nausea and vomiting.  Genitourinary: Negative.   Musculoskeletal:  Negative for back pain, joint pain and myalgias.  Skin:  Negative for rash.  Neurological:  Negative for tingling, loss of consciousness and weakness.  Endo/Heme/Allergies:  Negative for polydipsia.  Psychiatric/Behavioral: Negative.        Objective:     BP 136/72 (BP Location: Right Arm, Patient Position: Sitting, Cuff Size: Normal)   Pulse 84   Temp (!) 97.4 F (36.3 C) (Temporal)   Ht 5\' 7"  (1.702 m)    Wt 165 lb 6.4 oz (75 kg)   SpO2 97%   BMI 25.91 kg/m    Physical Exam   No results found for any visits on 12/29/21.    The 10-year ASCVD risk score (Arnett DK, et al., 2019) is: 2%    Assessment & Plan:   Problem List Items Addressed This Visit   None Visit Diagnoses     Cough, unspecified type    -  Primary   Relevant Medications   predniSONE (DELTASONE) 10 MG tablet   Other Relevant Orders   DG Chest 2 View   Right buttock pain       Relevant Orders   DG Hip Unilat W OR W/O Pelvis 2-3 Views Right   Screen for colon cancer       Relevant Orders   Ambulatory referral to Gastroenterology   Need for influenza vaccination       Relevant Orders   Flu Vaccine QUAD 6+ mos PF IM (Fluarix Quad PF)       Return if symptoms worsen or fail to improve.  Trial of prednisone for cough and may also help his buttock pain.  Return if not improving in 3 to 4 weeks.  Agrees to go for first colonoscopy.  Libby Maw, MD

## 2022-01-03 ENCOUNTER — Encounter: Payer: Self-pay | Admitting: Internal Medicine

## 2022-01-16 ENCOUNTER — Ambulatory Visit (AMBULATORY_SURGERY_CENTER): Payer: Self-pay

## 2022-01-16 VITALS — Ht 68.0 in | Wt 162.0 lb

## 2022-01-16 DIAGNOSIS — Z1211 Encounter for screening for malignant neoplasm of colon: Secondary | ICD-10-CM

## 2022-01-16 NOTE — Progress Notes (Signed)
No egg or soy allergy known to patient  No issues known to pt with past sedation with any surgeries or procedures Patient denies ever being told they had issues or difficulty with intubation  No FH of Malignant Hyperthermia Pt is not on diet pills Pt is not on  home 02  Pt is not on blood thinners  Pt denies issues with constipation  No A fib or A flutter Have any cardiac testing pending--no Pt instructed to use Singlecare.com or GoodRx for a price reduction on prep   Patient's chart reviewed by Osvaldo Angst CNRA prior to previsit and patient appropriate for the [place].  Previsit completed and red dot placed by patient's name on their procedure day (on provider's schedule).    Telephone Pre visit/Miralax & Dulcolax prep reviewed

## 2022-01-17 ENCOUNTER — Encounter: Payer: Self-pay | Admitting: Internal Medicine

## 2022-01-19 ENCOUNTER — Ambulatory Visit (INDEPENDENT_AMBULATORY_CARE_PROVIDER_SITE_OTHER): Payer: Managed Care, Other (non HMO)

## 2022-01-19 DIAGNOSIS — R0989 Other specified symptoms and signs involving the circulatory and respiratory systems: Secondary | ICD-10-CM

## 2022-01-19 DIAGNOSIS — R059 Cough, unspecified: Secondary | ICD-10-CM

## 2022-01-19 DIAGNOSIS — M7918 Myalgia, other site: Secondary | ICD-10-CM | POA: Diagnosis not present

## 2022-01-26 ENCOUNTER — Encounter: Payer: Managed Care, Other (non HMO) | Admitting: Internal Medicine

## 2022-02-06 ENCOUNTER — Ambulatory Visit: Payer: Managed Care, Other (non HMO) | Admitting: Student

## 2022-02-06 ENCOUNTER — Encounter: Payer: Self-pay | Admitting: Student

## 2022-02-06 VITALS — BP 124/88 | HR 79 | Ht 68.0 in | Wt 168.4 lb

## 2022-02-06 DIAGNOSIS — R053 Chronic cough: Secondary | ICD-10-CM

## 2022-02-06 MED ORDER — OMEPRAZOLE 40 MG PO CPDR: 40.0000 mg | DELAYED_RELEASE_CAPSULE | Freq: Every day | ORAL | 0 refills | Status: AC

## 2022-02-06 MED ORDER — ALBUTEROL SULFATE HFA 108 (90 BASE) MCG/ACT IN AERS: 2.0000 | INHALATION_SPRAY | Freq: Four times a day (QID) | RESPIRATORY_TRACT | 6 refills | Status: AC | PRN

## 2022-02-06 NOTE — Progress Notes (Signed)
Synopsis: Referred for cough, hyperinflation by Mliss Sax,*  Subjective:   PATIENT ID: Jake Diaz GENDER: male DOB: February 14, 1976, MRN: [id number]  Chief Complaint  Patient presents with   Consult   46yM with history of incarcerated inguinal hernia who is referred from PCP for 2 weeks of dry cough without PND, overt GERD sx. During workup found to have CXR with hyperinflation. Given prescription for prednisone.   He is doing well overall. Has had dry cough - wondered if it could be allergy but has never had issues in past with allergy, tried antihistamine with minor improvement. 8 weeks of cough actually. He doesn't think prednisone was helpful. No obvious heartburn./reflux. No sinus congestion/PND.   Works at home and wondered if his environment there is playing role. He doesn't have water damage at home.   Didn't have preceding URI/LRTI  He never did have covid-19  Otherwise pertinent review of systems is negative.  He has no family history of lung disease  He smoked socially for 4 years or so. 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 No vaping, MJ 25 ya. He has no pets. From [place], has been in [place] for 8 years.      Past Medical History:  Diagnosis Date   Incarcerated inguinal hernia      Family History  Problem Relation Age of Onset   Hypertension Mother    Diverticulitis Brother    Colon cancer Neg Hx    Colon polyps Neg Hx    Esophageal cancer Neg Hx    Rectal cancer Neg Hx    Stomach cancer Neg Hx      Past Surgical History:  Procedure Laterality Date   INGUINAL HERNIA REPAIR Left 06/19/2016   Procedure: LAPAROSCOPIC INGUINAL HERNIA;  Surgeon: Leafy Ro, MD;  Location: [facility] ORS;  Service: General;  Laterality: Left;   LAPAROSCOPIC INGUINAL HERNIA REPAIR  06/19/2016   VASECTOMY  03/2019    Social History   Socioeconomic History   Marital status: Married    Spouse name: Not on file   Number of children: Not  on file   Years of education: Not on file   Highest education level: Not on file  Occupational History   Not on file  Tobacco Use   Smoking status: Never   Smokeless tobacco: Never  Substance and Sexual Activity   Alcohol use: No   Drug use: No   Sexual activity: Not on file  Other Topics Concern   Not on file  Social History Narrative   Not on file   Social Determinants of Health   Financial Resource Strain: Not on file  Food Insecurity: Not on file  Transportation Needs: Not on file  Physical Activity: Not on file  Stress: Not on file  Social Connections: Not on file  Intimate Partner Violence: Not on file     No Known Allergies   Outpatient Medications Prior to Visit  Medication Sig Dispense Refill   cetirizine (ZYRTEC ALLERGY) 10 MG tablet      No facility-administered medications prior to visit.       Objective:   Physical Exam:  General appearance: 46 y.o., male, NAD, conversant, male, NAD, conversant  Eyes: anicteric sclerae; PERRL, tracking appropriately HENT: NCAT; MMM Neck: Trachea midline; no lymphadenopathy, no JVD Lungs: CTAB, no crackles, no wheeze, with normal respiratory effort CV: RRR, no murmur  Abdomen: Soft, non-tender; non-distended, BS present  Extremities: No peripheral edema, warm Skin: Normal turgor and texture;  no rash Psych: Appropriate affect Neuro: Alert and oriented to person and place, no focal deficit     Vitals:   02/06/22 1102  BP: 124/88  Pulse: 79  SpO2: 97%  Weight: 168 lb 6.4 oz (76.4 kg)  Height: 5\' 8"  (1.727 m)   97% on RA BMI Readings from Last 3 Encounters:  02/06/22 25.61 kg/m  01/16/22 24.63 kg/m  12/29/21 25.91 kg/m   Wt Readings from Last 3 Encounters:  02/06/22 168 lb 6.4 oz (76.4 kg)  01/16/22 162 lb (73.5 kg)  12/29/21 165 lb 6.4 oz (75 kg)     CBC    Component Value Date/Time   WBC 6.5 09/11/2019 1018   WBC 6.5 06/19/2016 0521   RBC 5.65 09/11/2019 1018   RBC 4.93 06/19/2016 0521   HGB 16.0 09/11/2019  1018   HCT 48.2 09/11/2019 1018   PLT 248 09/11/2019 1018   MCV 85 09/11/2019 1018   MCH 28.3 09/11/2019 1018   MCH 29.6 06/19/2016 0521   MCHC 33.2 09/11/2019 1018   MCHC 35.2 06/19/2016 0521   RDW 13.0 09/11/2019 1018    Chest Imaging: CXR borderline hyperinflation- flattened diaphragm but normal number of ribs seen above diaphragm (6 anteriorly at midclavicular line)  CT A/P lung bases 2018 normal  Pulmonary Functions Testing Results:     No data to display            Assessment & Plan:   # Subacute cough # Borderline hyperinflation on CXR  Plan: - Omeprazole 40 mg daily 30 minutes before breakfast or dinner EVERYDAY for 8 weeks or until next clinic visit - albuterol 1-2 puffs as needed and 5-20 minutes before exercise - can use dextromethorphan as needed if you like to try to suppress cough - send me a message if you'd like to try tessalon perles alternatively - see you in 8 weeks or sooner if need be!       Maryjane Hurter, MD [facility] 02/06/2022 11:07 AM

## 2022-02-06 NOTE — Patient Instructions (Addendum)
-   Omeprazole 40 mg daily 30 minutes before breakfast or dinner EVERYDAY for 8 weeks or until next clinic visit - albuterol 1-2 puffs as needed and 5-20 minutes before exercise - can use dextromethorphan as needed if you like to try to suppress cough - send me a message if you'd like to try tessalon perles alternatively - see you in 8 weeks or sooner if need be!

## 2022-04-03 ENCOUNTER — Ambulatory Visit: Payer: Managed Care, Other (non HMO) | Admitting: Student

## 2022-06-27 ENCOUNTER — Telehealth: Payer: Self-pay | Admitting: Pulmonary Disease

## 2022-06-27 NOTE — Telephone Encounter (Signed)
thanks

## 2022-06-27 NOTE — Telephone Encounter (Signed)
Spoke with patient he advises he doesn't want to make a f/u appointment at this time. Patient states is feeling much better is pleased with his over all experience here.   Dr. Erin Fulling just an [facility]

## 2022-06-27 NOTE — Telephone Encounter (Signed)
I called to set up Fu and PT states he is feeling better and that his exp here from front desk to medical staff was excellent.Pls let Dr. Erin Fulling know.

## 2023-08-20 ENCOUNTER — Ambulatory Visit (INDEPENDENT_AMBULATORY_CARE_PROVIDER_SITE_OTHER): Admitting: Family Medicine

## 2023-08-20 ENCOUNTER — Encounter: Payer: Self-pay | Admitting: Family Medicine

## 2023-08-20 VITALS — BP 122/68 | HR 80 | Temp 97.8°F | Ht 68.0 in | Wt 167.0 lb

## 2023-08-20 DIAGNOSIS — L2082 Flexural eczema: Secondary | ICD-10-CM | POA: Diagnosis not present

## 2023-08-20 DIAGNOSIS — Z0000 Encounter for general adult medical examination without abnormal findings: Secondary | ICD-10-CM | POA: Diagnosis not present

## 2023-08-20 DIAGNOSIS — Z1211 Encounter for screening for malignant neoplasm of colon: Secondary | ICD-10-CM

## 2023-08-20 DIAGNOSIS — Z13220 Encounter for screening for lipoid disorders: Secondary | ICD-10-CM

## 2023-08-20 DIAGNOSIS — Z7184 Encounter for health counseling related to travel: Secondary | ICD-10-CM

## 2023-08-20 MED ORDER — CLOTRIMAZOLE-BETAMETHASONE 1-0.05 % EX CREA: 1.0000 | TOPICAL_CREAM | Freq: Every day | CUTANEOUS | 0 refills | Status: AC

## 2023-08-20 NOTE — Progress Notes (Signed)
 Established Patient Office Visit   Subjective:  Patient ID: Jake Diaz, male    DOB: 14-Nov-1975  Age: 48 y.o. MRN: [id number]  Chief Complaint  Patient presents with   Annual Exam    CPE. Pt is not fasting     HPI Encounter Diagnoses  Name Primary?   Healthcare maintenance Yes   Flexural eczema    Screening for cholesterol level    Counseling for travel    Screening for colon cancer    Here for physical.  Doing well.  He has a rash on his right arm that he would like me to see.  It has been present for couple of weeks and is mildly pruritic.  He is planning on traveling to [place] for 10 weeks with his wife so that she can visit her home.  He has never had a colonoscopy.   Review of Systems  Constitutional: Negative.   HENT: Negative.    Eyes:  Negative for blurred vision, discharge and redness.  Respiratory: Negative.    Cardiovascular: Negative.   Gastrointestinal:  Negative for abdominal pain.  Genitourinary: Negative.   Musculoskeletal: Negative.  Negative for myalgias.  Skin:  Positive for itching. Negative for rash.  Neurological:  Negative for tingling, loss of consciousness and weakness.  Endo/Heme/Allergies:  Negative for polydipsia.      08/20/2023    4:22 PM 12/29/2021    2:04 PM 09/11/2019   10:03 AM  Depression screen PHQ 2/9  Decreased Interest 0 0 0  Down, Depressed, Hopeless 0 0 0  PHQ - 2 Score 0 0 0  Altered sleeping 0  0  Tired, decreased energy 0  0  Change in appetite 0  0  Feeling bad or failure about yourself  0  0  Trouble concentrating 0  0  Moving slowly or fidgety/restless 0  0  Suicidal thoughts 0  0  PHQ-9 Score 0  0  Difficult doing work/chores Not difficult at all  Not difficult at all       Current Outpatient Medications:    clotrimazole-betamethasone (LOTRISONE) cream, Apply 1 Application topically daily., Disp: 30 g, Rfl: 0   albuterol  (VENTOLIN  HFA) 108 (90 Base) MCG/ACT inhaler, Inhale 2 puffs into the lungs every  6 (six) hours as needed for wheezing or shortness of breath. (Patient not taking: Reported on 08/20/2023), Disp: 8 g, Rfl: 6   omeprazole  (PRILOSEC) 40 MG capsule, Take 1 capsule (40 mg total) by mouth daily. (Patient not taking: Reported on 08/20/2023), Disp: 90 capsule, Rfl: 0   Objective:     BP 122/68 (Cuff Size: Normal)   Pulse 80   Temp 97.8 F (36.6 C) (Temporal)   Ht 5\' 8"  (1.727 m)   Wt 167 lb (75.8 kg)   SpO2 98%   BMI 25.39 kg/m    Physical Exam Constitutional:      General: He is not in acute distress.    Appearance: Normal appearance. He is not ill-appearing, toxic-appearing or diaphoretic.  HENT:     Head: Normocephalic and atraumatic.     Right Ear: Tympanic membrane, ear canal and external ear normal.     Left Ear: Tympanic membrane, ear canal and external ear normal.     Mouth/Throat:     Mouth: Mucous membranes are moist.     Pharynx: Oropharynx is clear. No oropharyngeal exudate or posterior oropharyngeal erythema.  Eyes:     General: No scleral icterus.       Right eye:  No discharge.        Left eye: No discharge.     Extraocular Movements: Extraocular movements intact.     Conjunctiva/sclera: Conjunctivae normal.     Pupils: Pupils are equal, round, and reactive to light.  Cardiovascular:     Rate and Rhythm: Normal rate and regular rhythm.  Pulmonary:     Effort: Pulmonary effort is normal. No respiratory distress.     Breath sounds: Normal breath sounds. No wheezing or rhonchi.  Abdominal:     General: Bowel sounds are normal.     Tenderness: There is no abdominal tenderness. There is no guarding or rebound.     Hernia: There is no hernia in the left inguinal area or right inguinal area.  Genitourinary:    Penis: Circumcised. No hypospadias, erythema, tenderness, discharge, swelling or lesions.      Testes:        Right: Mass, tenderness or swelling not present. Right testis is descended.        Left: Mass, tenderness or swelling not present. Left  testis is descended.     Epididymis:     Right: Not inflamed or enlarged.     Left: Not inflamed or enlarged.  Musculoskeletal:     Cervical back: No rigidity or tenderness.  Lymphadenopathy:     Lower Body: No right inguinal adenopathy. No left inguinal adenopathy.  Skin:    General: Skin is warm and dry.       Neurological:     Mental Status: He is alert and oriented to person, place, and time.  Psychiatric:        Mood and Affect: Mood normal.        Behavior: Behavior normal.      No results found for any visits on 08/20/23.    The ASCVD Risk score (Arnett DK, et al., 2019) failed to calculate for the following reasons:   Cannot find a previous HDL lab   Cannot find a previous total cholesterol lab    Assessment & Plan:   Healthcare maintenance -     CBC with Differential/Platelet; Future -     Urinalysis, Routine w reflex microscopic; Future  Flexural eczema -     Clotrimazole-Betamethasone; Apply 1 Application topically daily.  Dispense: 30 g; Refill: 0  Screening for cholesterol level -     Comprehensive metabolic panel with GFR; Future -     Lipid panel; Future  Counseling for travel -     Ambulatory referral to Travel Clinic  Screening for colon cancer -     Ambulatory referral to Gastroenterology    Return in about 1 year (around 08/19/2024), or if symptoms worsen or fail to improve, for Please return fasting for ordered blood work..  Encouraged regular exercise for 30 minutes 5 days weekly.  Referral for his first colonoscopy.  Referral to the travel clinic for advice on needed vaccinations and malaria prophylaxis.   Tonna Frederic, MD

## 2023-08-27 ENCOUNTER — Other Ambulatory Visit

## 2024-08-21 ENCOUNTER — Encounter: Admitting: Family Medicine
